# Patient Record
Sex: Female | Born: 1965
Health system: Southern US, Community
[De-identification: ages and names within clinical notes are randomized; demographics above are authoritative.]

## PROBLEM LIST (undated history)

## (undated) DIAGNOSIS — D649 Anemia, unspecified: Secondary | ICD-10-CM

## (undated) DIAGNOSIS — R600 Localized edema: Secondary | ICD-10-CM

## (undated) DIAGNOSIS — T7840XA Allergy, unspecified, initial encounter: Secondary | ICD-10-CM

## (undated) HISTORY — DX: Allergy, unspecified, initial encounter: T78.40XA

## (undated) HISTORY — DX: Localized edema: R60.0

## (undated) HISTORY — DX: Anemia, unspecified: D64.9

## (undated) HISTORY — PX: NO PAST SURGERIES: SHX2092

## (undated) HISTORY — PX: OTHER SURGICAL HISTORY: SHX169

---

## 2000-10-18 ENCOUNTER — Encounter: Payer: Self-pay | Admitting: Emergency Medicine

## 2000-10-18 ENCOUNTER — Emergency Department (HOSPITAL_COMMUNITY): Admission: EM | Admit: 2000-10-18 | Discharge: 2000-10-18 | Payer: Self-pay | Admitting: Emergency Medicine

## 2002-12-29 ENCOUNTER — Ambulatory Visit (HOSPITAL_COMMUNITY): Admission: RE | Admit: 2002-12-29 | Discharge: 2002-12-29 | Payer: Self-pay | Admitting: Obstetrics & Gynecology

## 2003-01-14 ENCOUNTER — Inpatient Hospital Stay (HOSPITAL_COMMUNITY): Admission: AD | Admit: 2003-01-14 | Discharge: 2003-01-17 | Payer: Self-pay | Admitting: Obstetrics & Gynecology

## 2003-01-18 ENCOUNTER — Encounter: Admission: RE | Admit: 2003-01-18 | Discharge: 2003-02-17 | Payer: Self-pay | Admitting: Obstetrics & Gynecology

## 2003-03-20 ENCOUNTER — Encounter: Admission: RE | Admit: 2003-03-20 | Discharge: 2003-04-19 | Payer: Self-pay | Admitting: Obstetrics & Gynecology

## 2003-04-20 ENCOUNTER — Encounter: Admission: RE | Admit: 2003-04-20 | Discharge: 2003-05-20 | Payer: Self-pay | Admitting: Obstetrics & Gynecology

## 2003-06-18 ENCOUNTER — Encounter: Admission: RE | Admit: 2003-06-18 | Discharge: 2003-07-18 | Payer: Self-pay | Admitting: Obstetrics & Gynecology

## 2003-08-18 ENCOUNTER — Encounter: Admission: RE | Admit: 2003-08-18 | Discharge: 2003-09-17 | Payer: Self-pay | Admitting: Obstetrics & Gynecology

## 2006-03-05 ENCOUNTER — Encounter: Admission: RE | Admit: 2006-03-05 | Discharge: 2006-03-05 | Payer: Self-pay | Admitting: Obstetrics & Gynecology

## 2007-07-05 ENCOUNTER — Encounter: Admission: RE | Admit: 2007-07-05 | Discharge: 2007-07-05 | Payer: Self-pay | Admitting: Obstetrics & Gynecology

## 2007-10-06 ENCOUNTER — Emergency Department (HOSPITAL_COMMUNITY): Admission: EM | Admit: 2007-10-06 | Discharge: 2007-10-07 | Payer: Self-pay | Admitting: Emergency Medicine

## 2008-12-13 ENCOUNTER — Encounter: Admission: RE | Admit: 2008-12-13 | Discharge: 2008-12-13 | Payer: Self-pay | Admitting: Family Medicine

## 2010-03-23 ENCOUNTER — Encounter: Payer: Self-pay | Admitting: Family Medicine

## 2010-03-25 ENCOUNTER — Encounter: Payer: Self-pay | Admitting: Obstetrics & Gynecology

## 2010-09-16 ENCOUNTER — Other Ambulatory Visit (HOSPITAL_COMMUNITY): Payer: Self-pay | Admitting: Family Medicine

## 2010-09-16 DIAGNOSIS — Z1231 Encounter for screening mammogram for malignant neoplasm of breast: Secondary | ICD-10-CM

## 2010-09-24 ENCOUNTER — Ambulatory Visit (HOSPITAL_COMMUNITY)
Admission: RE | Admit: 2010-09-24 | Discharge: 2010-09-24 | Disposition: A | Discharge status: Home / Self Care | Payer: BC Managed Care – PPO | Source: Ambulatory Visit | Attending: Family Medicine | Admitting: Family Medicine

## 2010-09-24 DIAGNOSIS — Z1231 Encounter for screening mammogram for malignant neoplasm of breast: Secondary | ICD-10-CM | POA: Insufficient documentation

## 2011-09-15 ENCOUNTER — Other Ambulatory Visit (HOSPITAL_COMMUNITY): Payer: Self-pay | Admitting: Family Medicine

## 2011-09-15 DIAGNOSIS — Z1231 Encounter for screening mammogram for malignant neoplasm of breast: Secondary | ICD-10-CM

## 2011-10-02 ENCOUNTER — Ambulatory Visit (HOSPITAL_COMMUNITY)
Admission: RE | Admit: 2011-10-02 | Discharge: 2011-10-02 | Disposition: A | Discharge status: Home / Self Care | Payer: BC Managed Care – PPO | Source: Ambulatory Visit | Attending: Family Medicine | Admitting: Family Medicine

## 2011-10-02 DIAGNOSIS — Z1231 Encounter for screening mammogram for malignant neoplasm of breast: Secondary | ICD-10-CM | POA: Insufficient documentation

## 2011-10-03 ENCOUNTER — Encounter: Payer: BC Managed Care – PPO | Admitting: Advanced Practice Midwife

## 2011-10-31 ENCOUNTER — Encounter: Payer: BC Managed Care – PPO | Admitting: Obstetrics & Gynecology

## 2012-05-14 ENCOUNTER — Encounter: Payer: BC Managed Care – PPO | Admitting: Obstetrics & Gynecology

## 2012-06-16 ENCOUNTER — Encounter: Payer: BC Managed Care – PPO | Admitting: Obstetrics & Gynecology

## 2014-02-28 ENCOUNTER — Other Ambulatory Visit (HOSPITAL_COMMUNITY): Payer: Self-pay | Admitting: Family Medicine

## 2014-02-28 DIAGNOSIS — Z1231 Encounter for screening mammogram for malignant neoplasm of breast: Secondary | ICD-10-CM

## 2014-03-16 ENCOUNTER — Ambulatory Visit (HOSPITAL_COMMUNITY)
Admission: RE | Admit: 2014-03-16 | Discharge: 2014-03-16 | Disposition: A | Discharge status: Home / Self Care | Payer: BC Managed Care – PPO | Source: Ambulatory Visit | Attending: Family Medicine | Admitting: Family Medicine

## 2014-03-16 DIAGNOSIS — Z1231 Encounter for screening mammogram for malignant neoplasm of breast: Secondary | ICD-10-CM | POA: Insufficient documentation

## 2015-02-27 ENCOUNTER — Other Ambulatory Visit (HOSPITAL_BASED_OUTPATIENT_CLINIC_OR_DEPARTMENT_OTHER): Payer: Self-pay | Admitting: Internal Medicine

## 2015-02-27 DIAGNOSIS — Z1231 Encounter for screening mammogram for malignant neoplasm of breast: Secondary | ICD-10-CM

## 2015-04-26 ENCOUNTER — Ambulatory Visit (HOSPITAL_BASED_OUTPATIENT_CLINIC_OR_DEPARTMENT_OTHER): Payer: BC Managed Care – PPO

## 2015-06-01 ENCOUNTER — Ambulatory Visit (HOSPITAL_BASED_OUTPATIENT_CLINIC_OR_DEPARTMENT_OTHER): Payer: BC Managed Care – PPO

## 2015-06-12 ENCOUNTER — Ambulatory Visit (HOSPITAL_BASED_OUTPATIENT_CLINIC_OR_DEPARTMENT_OTHER)
Admission: RE | Admit: 2015-06-12 | Discharge: 2015-06-12 | Disposition: A | Discharge status: Home / Self Care | Payer: BC Managed Care – PPO | Source: Ambulatory Visit | Attending: Internal Medicine | Admitting: Internal Medicine

## 2015-06-12 DIAGNOSIS — Z1231 Encounter for screening mammogram for malignant neoplasm of breast: Secondary | ICD-10-CM | POA: Diagnosis not present

## 2015-07-24 ENCOUNTER — Ambulatory Visit: Payer: Self-pay | Admitting: Obstetrics

## 2015-08-16 ENCOUNTER — Ambulatory Visit: Payer: Self-pay | Admitting: Obstetrics

## 2015-09-03 ENCOUNTER — Ambulatory Visit (INDEPENDENT_AMBULATORY_CARE_PROVIDER_SITE_OTHER): Payer: BC Managed Care – PPO | Admitting: Obstetrics & Gynecology

## 2015-09-03 ENCOUNTER — Encounter: Payer: Self-pay | Admitting: Obstetrics & Gynecology

## 2015-09-03 VITALS — BP 105/67 | HR 63 | Resp 16 | Ht 63.0 in | Wt 166.0 lb

## 2015-09-03 DIAGNOSIS — N921 Excessive and frequent menstruation with irregular cycle: Secondary | ICD-10-CM | POA: Diagnosis not present

## 2015-09-03 DIAGNOSIS — D259 Leiomyoma of uterus, unspecified: Secondary | ICD-10-CM | POA: Diagnosis not present

## 2015-09-03 DIAGNOSIS — Z113 Encounter for screening for infections with a predominantly sexual mode of transmission: Secondary | ICD-10-CM

## 2015-09-03 NOTE — Progress Notes (Signed)
Patient ID: ADISYN KAMAN, female   DOB: 09/23/1965, 50 y.o.   MRN: [id number] History:  50 y.o. G2P1011 here today for eval of AUB.  [id number].  LNMP 05/29/2015. Pt reports that April 24-28 she reports bleeding like reg menes.  Pt reports bleeding again May 9-16 the bleeding was heavy with clots.  Pt began menses June 2-Jun 7.  Then Jun 24-Jun 29th.    Pt reports that her initial cycles was 28 days.  Pt reports increased risk of cramping.   Pt reports hot flashes.  Sx began Jan 2017.  Pt denies mood changes.      Pt is not sexually active currently.  Was screen for STI's 2 years prev.  Uses condoms with all intercourse.  Pt not on birth control has always used condoms.  The following portions of the patient's history were reviewed and updated as appropriate: allergies, current medications, past family history, past medical history, past social history, past surgical history and problem list.  Review of Systems:  Pertinent items are noted in HPI.  Objective:  Physical Exam Blood pressure 105/67, pulse 63, resp. rate 16, height 5\' 3"  (1.6 m), weight 166 lb (75.297 kg), last menstrual period 08/25/2015. Gen: NAD Abd: Soft, nontender and nondistended Pelvic: Normal appearing external genitalia; normal appearing vaginal mucosa and cervix.  Normal discharge.  Enlarged uterus- difficult to assess size due to firm rectus, no other palpable masses, no uterine or adnexal tenderness    Assessment & Plan:  Perimenopausal sx AUB- need to r/o STI   Fibroids   F/u STI screen cx and blood work Pelvic sono F/u in 3 months  Gottlieb Zuercher L. Harraway-Smith, M.D., Cherlynn June

## 2015-09-03 NOTE — Patient Instructions (Addendum)
Perimenopause Perimenopause is the time when your body begins to move into the menopause (no menstrual period for 12 straight months). It is a natural process. Perimenopause can begin 2-8 years before the menopause and usually lasts for 1 year after the menopause. During this time, your ovaries may or may not produce an egg. The ovaries vary in their production of estrogen and progesterone hormones each month. This can cause irregular menstrual periods, difficulty getting pregnant, vaginal bleeding between periods, and uncomfortable symptoms. CAUSES  Irregular production of the ovarian hormones, estrogen and progesterone, and not ovulating every month.  Other causes include:  Tumor of the pituitary gland in the brain.  Medical disease that affects the ovaries.  Radiation treatment.  Chemotherapy.  Unknown causes.  Heavy smoking and excessive alcohol intake can bring on perimenopause sooner. SIGNS AND SYMPTOMS   Hot flashes.  Night sweats.  Irregular menstrual periods.  Decreased sex drive.  Vaginal dryness.  Headaches.  Mood swings.  Depression.  Memory problems.  Irritability.  Tiredness.  Weight gain.  Trouble getting pregnant.  The beginning of losing bone cells (osteoporosis).  The beginning of hardening of the arteries (atherosclerosis). DIAGNOSIS  Your health care provider will make a diagnosis by analyzing your age, menstrual history, and symptoms. He or she will do a physical exam and note any changes in your body, especially your female organs. Female hormone tests may or may not be helpful depending on the amount of female hormones you produce and when you produce them. However, other hormone tests may be helpful to rule out other problems. TREATMENT  In some cases, no treatment is needed. The decision on whether treatment is necessary during the perimenopause should be made by you and your health care provider based on how the symptoms are affecting you  and your lifestyle. Various treatments are available, such as:  Treating individual symptoms with a specific medicine for that symptom.  Herbal medicines that can help specific symptoms.  Counseling.  Group therapy. HOME CARE INSTRUCTIONS   Keep track of your menstrual periods (when they occur, how heavy they are, how long between periods, and how long they last) as well as your symptoms and when they started.  Only take over-the-counter or prescription medicines as directed by your health care provider.  Sleep and rest.  Exercise.  Eat a diet that contains calcium (good for your bones) and soy (acts like the estrogen hormone).  Do not smoke.  Avoid alcoholic beverages.  Take vitamin supplements as recommended by your health care provider. Taking vitamin E may help in certain cases.  Take calcium and vitamin D supplements to help prevent bone loss.  Group therapy is sometimes helpful.  Acupuncture may help in some cases. SEEK MEDICAL CARE IF:   You have questions about any symptoms you are having.  You need a referral to a specialist (gynecologist, psychiatrist, or psychologist). SEEK IMMEDIATE MEDICAL CARE IF:   You have vaginal bleeding.  Your period lasts longer than 8 days.  Your periods are recurring sooner than 21 days.  You have bleeding after intercourse.  You have severe depression.  You have pain when you urinate.  You have severe headaches.  You have vision problems.   This information is not intended to replace advice given to you by your health care provider. Make sure you discuss any questions you have with your health care provider.   Document Released: 03/27/2004 Document Revised: 03/10/2014 Document Reviewed: 09/16/2012 Elsevier Interactive Patient Education [employer].  Uterine Fibroids Uterine fibroids are tissue masses (tumors) that can develop in the womb (uterus). They are also called leiomyomas. This type of tumor is not  cancerous (benign) and does not spread to other parts of the body outside of the pelvic area, which is between the hip bones. Occasionally, fibroids may develop in the fallopian tubes, in the cervix, or on the support structures (ligaments) that surround the uterus. You can have one or many fibroids. Fibroids can vary in size, weight, and where they grow in the uterus. Some can become quite large. Most fibroids do not require medical treatment. CAUSES A fibroid can develop when a single uterine cell keeps growing (replicating). Most cells in the human body have a control mechanism that keeps them from replicating without control. SIGNS AND SYMPTOMS Symptoms may include:  Heavy bleeding during your period. Bleeding or spotting between periods. Pelvic pain and pressure. Bladder problems, such as needing to urinate more often (urinary frequency) or urgently. Inability to reproduce offspring (infertility). Miscarriages. DIAGNOSIS Uterine fibroids are diagnosed through a physical exam. Your health care provider may feel the lumpy tumors during a pelvic exam. Ultrasonography and an MRI may be done to determine the size, location, and number of fibroids. TREATMENT Treatment may include: Watchful waiting. This involves getting the fibroid checked by your health care provider to see if it grows or shrinks. Follow your health care provider's recommendations for how often to have this checked. Hormone medicines. These can be taken by mouth or given through an intrauterine device (IUD). Surgery. Removing the fibroids (myomectomy) or the uterus (hysterectomy). Removing blood supply to the fibroids (uterine artery embolization). If fibroids interfere with your fertility and you want to become pregnant, your health care provider may recommend having the fibroids removed.  HOME CARE INSTRUCTIONS Keep all follow-up visits as directed by your health care provider. This is important. Take medicines only as  directed by your health care provider. If you were prescribed a hormone treatment, take the hormone medicines exactly as directed. Do not take aspirin, because it can cause bleeding. Ask your health care provider about taking iron pills and increasing the amount of dark green, leafy vegetables in your diet. These actions can help to boost your blood iron levels, which may be affected by heavy menstrual bleeding. Pay close attention to your period and tell your health care provider about any changes, such as: Increased blood flow that requires you to use more pads or tampons than usual per month. A change in the number of days that your period lasts per month. A change in symptoms that are associated with your period, such as abdominal cramping or back pain. SEEK MEDICAL CARE IF: You have pelvic pain, back pain, or abdominal cramps that cannot be controlled with medicines. You have an increase in bleeding between and during periods. You soak tampons or pads in a half hour or less. You feel lightheaded, extra tired, or weak. SEEK IMMEDIATE MEDICAL CARE IF: You faint. You have a sudden increase in pelvic pain.   This information is not intended to replace advice given to you by your health care provider. Make sure you discuss any questions you have with your health care provider.   Document Released: 02/15/2000 Document Revised: 03/10/2014 Document Reviewed: 08/16/2013 Elsevier Interactive Patient Education [employer].

## 2015-09-04 LAB — HEPATITIS PANEL, ACUTE
HCV Ab: NEGATIVE
Hep A IgM: NONREACTIVE
Hep B C IgM: NONREACTIVE
Hepatitis B Surface Ag: NEGATIVE

## 2015-09-04 LAB — HIV ANTIBODY (ROUTINE TESTING W REFLEX): HIV 1&2 Ab, 4th Generation: NONREACTIVE

## 2015-09-04 LAB — RPR

## 2015-09-05 ENCOUNTER — Ambulatory Visit (HOSPITAL_BASED_OUTPATIENT_CLINIC_OR_DEPARTMENT_OTHER)
Admission: RE | Admit: 2015-09-05 | Discharge: 2015-09-05 | Disposition: A | Discharge status: Home / Self Care | Payer: BC Managed Care – PPO | Source: Ambulatory Visit | Attending: Obstetrics & Gynecology | Admitting: Obstetrics & Gynecology

## 2015-09-05 DIAGNOSIS — D251 Intramural leiomyoma of uterus: Secondary | ICD-10-CM | POA: Diagnosis not present

## 2015-09-05 DIAGNOSIS — D259 Leiomyoma of uterus, unspecified: Secondary | ICD-10-CM | POA: Diagnosis not present

## 2015-09-05 DIAGNOSIS — N83201 Unspecified ovarian cyst, right side: Secondary | ICD-10-CM | POA: Diagnosis not present

## 2015-09-05 DIAGNOSIS — N921 Excessive and frequent menstruation with irregular cycle: Secondary | ICD-10-CM

## 2015-09-05 DIAGNOSIS — D250 Submucous leiomyoma of uterus: Secondary | ICD-10-CM | POA: Insufficient documentation

## 2015-09-05 LAB — GC/CHLAMYDIA PROBE AMP (~~LOC~~) NOT AT ARMC
Chlamydia: NEGATIVE
Neisseria Gonorrhea: NEGATIVE

## 2015-10-15 DIAGNOSIS — Z6830 Body mass index (BMI) 30.0-30.9, adult: Secondary | ICD-10-CM | POA: Diagnosis not present

## 2015-10-15 DIAGNOSIS — E559 Vitamin D deficiency, unspecified: Secondary | ICD-10-CM | POA: Diagnosis not present

## 2015-10-15 DIAGNOSIS — N926 Irregular menstruation, unspecified: Secondary | ICD-10-CM | POA: Diagnosis not present

## 2015-10-15 DIAGNOSIS — M25511 Pain in right shoulder: Secondary | ICD-10-CM | POA: Diagnosis not present

## 2015-10-26 ENCOUNTER — Encounter: Payer: Self-pay | Admitting: *Deleted

## 2016-04-04 DIAGNOSIS — J1089 Influenza due to other identified influenza virus with other manifestations: Secondary | ICD-10-CM | POA: Diagnosis not present

## 2016-08-18 ENCOUNTER — Encounter: Payer: Self-pay | Admitting: Obstetrics & Gynecology

## 2016-08-18 ENCOUNTER — Ambulatory Visit (INDEPENDENT_AMBULATORY_CARE_PROVIDER_SITE_OTHER): Payer: BC Managed Care – PPO | Admitting: Obstetrics & Gynecology

## 2016-08-18 VITALS — BP 112/66 | HR 65 | Ht 61.0 in | Wt 177.0 lb

## 2016-08-18 DIAGNOSIS — Z01419 Encounter for gynecological examination (general) (routine) without abnormal findings: Secondary | ICD-10-CM

## 2016-08-18 DIAGNOSIS — Z1151 Encounter for screening for human papillomavirus (HPV): Secondary | ICD-10-CM

## 2016-08-18 DIAGNOSIS — Z1239 Encounter for other screening for malignant neoplasm of breast: Secondary | ICD-10-CM

## 2016-08-18 DIAGNOSIS — D250 Submucous leiomyoma of uterus: Secondary | ICD-10-CM

## 2016-08-18 DIAGNOSIS — N921 Excessive and frequent menstruation with irregular cycle: Secondary | ICD-10-CM

## 2016-08-18 NOTE — Patient Instructions (Addendum)
Uterine Fibroids Uterine fibroids are tissue masses (tumors) that can develop in the womb (uterus). They are also called leiomyomas. This type of tumor is not cancerous (benign) and does not spread to other parts of the body outside of the pelvic area, which is between the hip bones. Occasionally, fibroids may develop in the fallopian tubes, in the cervix, or on the support structures (ligaments) that surround the uterus. You can have one or many fibroids. Fibroids can vary in size, weight, and where they grow in the uterus. Some can become quite large. Most fibroids do not require medical treatment. What are the causes? A fibroid can develop when a single uterine cell keeps growing (replicating). Most cells in the human body have a control mechanism that keeps them from replicating without control. What are the signs or symptoms? Symptoms may include:  Heavy bleeding during your period.  Bleeding or spotting between periods.  Pelvic pain and pressure.  Bladder problems, such as needing to urinate more often (urinary frequency) or urgently.  Inability to reproduce offspring (infertility).  Miscarriages.  How is this diagnosed? Uterine fibroids are diagnosed through a physical exam. Your health care provider may feel the lumpy tumors during a pelvic exam. Ultrasonography and an MRI may be done to determine the size, location, and number of fibroids. How is this treated? Treatment may include:  Watchful waiting. This involves getting the fibroid checked by your health care provider to see if it grows or shrinks. Follow your health care provider's recommendations for how often to have this checked.  Hormone medicines. These can be taken by mouth or given through an intrauterine device (IUD).  Surgery. ? Removing the fibroids (myomectomy) or the uterus (hysterectomy). ? Removing blood supply to the fibroids (uterine artery embolization).  If fibroids interfere with your fertility and you  want to become pregnant, your health care provider may recommend having the fibroids removed. Follow these instructions at home:  Keep all follow-up visits as directed by your health care provider. This is important.  Take over-the-counter and prescription medicines only as told by your health care provider. ? If you were prescribed a hormone treatment, take the hormone medicines exactly as directed.  Ask your health care provider about taking iron pills and increasing the amount of dark green, leafy vegetables in your diet. These actions can help to boost your blood iron levels, which may be affected by heavy menstrual bleeding.  Pay close attention to your period and tell your health care provider about any changes, such as: ? Increased blood flow that requires you to use more pads or tampons than usual per month. ? A change in the number of days that your period lasts per month. ? A change in symptoms that are associated with your period, such as abdominal cramping or back pain. Contact a health care provider if:  You have pelvic pain, back pain, or abdominal cramps that cannot be controlled with medicines.  You have an increase in bleeding between and during periods.  You soak tampons or pads in a half hour or less.  You feel lightheaded, extra tired, or weak. Get help right away if:  You faint.  You have a sudden increase in pelvic pain. This information is not intended to replace advice given to you by your health care provider. Make sure you discuss any questions you have with your health care provider. Document Released: 02/15/2000 Document Revised: 10/18/2015 Document Reviewed: 08/16/2013 Elsevier Interactive Patient Education  2018 Elsevier Inc[employer]    intrauterine device (IUD) What is this medicine? LEVONORGESTREL IUD (LEE voe nor jes trel) is a contraceptive (birth control) device. The device is placed inside the uterus by a healthcare professional. It is used to  prevent pregnancy. This device can also be used to treat heavy bleeding that occurs during your period. This medicine may be used for other purposes; ask your health care provider or pharmacist if you have questions. COMMON BRAND NAME(S): Kyleena, LILETTA, Mirena, Skyla What should I tell my health care provider before I take this medicine? They need to know if you have any of these conditions: -abnormal Pap smear -cancer of the breast, uterus, or cervix -diabetes -endometritis -genital or pelvic infection now or in the past -have more than one sexual partner or your partner has more than one partner -heart disease -history of an ectopic or tubal pregnancy -immune system problems -IUD in place -liver disease or tumor -problems with blood clots or take blood-thinners -seizures -use intravenous drugs -uterus of unusual shape -vaginal bleeding that has not been explained -an unusual or allergic reaction to levonorgestrel, other hormones, silicone, or polyethylene, medicines, foods, dyes, or preservatives -pregnant or trying to get pregnant -breast-feeding How should I use this medicine? This device is placed inside the uterus by a health care professional. Talk to your pediatrician regarding the use of this medicine in children. Special care may be needed. Overdosage: If you think you have taken too much of this medicine contact a poison control center or emergency room at once. NOTE: This medicine is only for you. Do not share this medicine with others. What if I miss a dose? This does not apply. Depending on the brand of device you have inserted, the device will need to be replaced every 3 to 5 years if you wish to continue using this type of birth control. What may interact with this medicine? Do not take this medicine with any of the following medications: -amprenavir -bosentan -fosamprenavir This medicine may also interact with the following  medications: -aprepitant -armodafinil -barbiturate medicines for inducing sleep or treating seizures -bexarotene -boceprevir -griseofulvin -medicines to treat seizures like carbamazepine, ethotoin, felbamate, oxcarbazepine, phenytoin, topiramate -modafinil -pioglitazone -rifabutin -rifampin -rifapentine -some medicines to treat HIV infection like atazanavir, efavirenz, indinavir, lopinavir, nelfinavir, tipranavir, ritonavir -St. John's wort -warfarin This list may not describe all possible interactions. Give your health care provider a list of all the medicines, herbs, non-prescription drugs, or dietary supplements you use. Also tell them if you smoke, drink alcohol, or use illegal drugs. Some items may interact with your medicine. What should I watch for while using this medicine? Visit your doctor or health care professional for regular check ups. See your doctor if you or your partner has sexual contact with others, becomes HIV positive, or gets a sexual transmitted disease. This product does not protect you against HIV infection (AIDS) or other sexually transmitted diseases. You can check the placement of the IUD yourself by reaching up to the top of your vagina with clean fingers to feel the threads. Do not pull on the threads. It is a good habit to check placement after each menstrual period. Call your doctor right away if you feel more of the IUD than just the threads or if you cannot feel the threads at all. The IUD may come out by itself. You may become pregnant if the device comes out. If you notice that the IUD has come out use a backup birth control method like condoms and call your   health care provider. Using tampons will not change the position of the IUD and are okay to use during your period. This IUD can be safely scanned with magnetic resonance imaging (MRI) only under specific conditions. Before you have an MRI, tell your healthcare provider that you have an IUD in place,  and which type of IUD you have in place. What side effects may I notice from receiving this medicine? Side effects that you should report to your doctor or health care professional as soon as possible: -allergic reactions like skin rash, itching or hives, swelling of the face, lips, or tongue -fever, flu-like symptoms -genital sores -high blood pressure -no menstrual period for 6 weeks during use -pain, swelling, warmth in the leg -pelvic pain or tenderness -severe or sudden headache -signs of pregnancy -stomach cramping -sudden shortness of breath -trouble with balance, talking, or walking -unusual vaginal bleeding, discharge -yellowing of the eyes or skin Side effects that usually do not require medical attention (report to your doctor or health care professional if they continue or are bothersome): -acne -breast pain -change in sex drive or performance -changes in weight -cramping, dizziness, or faintness while the device is being inserted -headache -irregular menstrual bleeding within first 3 to 6 months of use -nausea This list may not describe all possible side effects. Call your doctor for medical advice about side effects. You may report side effects to FDA at 1-800-FDA-1088. Where should I keep my medicine? This does not apply. NOTE: This sheet is a summary. It may not cover all possible information. If you have questions about this medicine, talk to your doctor, pharmacist, or health care provider.  2018 Elsevier/Gold Standard (2015-11-30 14:14:56)  

## 2016-08-18 NOTE — Progress Notes (Signed)
Patient states she present for follow up visit (last visit July 2017).patient would like her annual with pap smear today. Kathrene Alu RNBSN

## 2016-08-18 NOTE — Progress Notes (Signed)
Subjective:     Dawn Leach is a 51 y.o. female here for a routine exam. G2P1011. Pt is s/p SVD of a 5#11oz infant.  Current complaints: Pt report bleeding every 17-21 days for 7 days. She endorses the bleeding as heavy with clots at times.She reports that she has gotten used to the bleeding but, now c/o pain in the pelvis.   Pt denies dizziness but, reports occ SOB   Gynecologic History Patient's last menstrual period was 08/16/2016 (exact date). Contraception: abstinence Last Pap: 2016. Results were: normal Last mammogram: 06/2015. Results were: normal  Obstetric History OB History  Gravida Para Term Preterm AB Living  2 1 1  0 1 1  SAB TAB Ectopic Multiple Live Births  1 0 0 0 1    # Outcome Date GA Lbr Len/2nd Weight Sex Delivery Anes PTL Lv  2 SAB      SAB     1 Term      Vag-Spont   LIV       The following portions of the patient's history were reviewed and updated as appropriate: allergies, current medications, past family history, past medical history, past social history, past surgical history and problem list.  Review of Systems Pertinent items are noted in HPI.    Objective:  BP 112/66 (BP Location: Left Arm)   Pulse 65   Ht 5\' 1"  (1.549 m)   Wt 177 lb (80.3 kg)   LMP 08/16/2016 (Exact Date)   BMI 33.44 kg/m  General Appearance:    Alert, cooperative, no distress, appears stated age  Head:    Normocephalic, without obvious abnormality, atraumatic  Eyes:    conjunctiva/corneas clear, EOM's intact, both eyes  Ears:    Normal external ear canals, both ears  Nose:   Nares normal, septum midline, mucosa normal, no drainage    or sinus tenderness  Throat:   Lips, mucosa, and tongue normal; teeth and gums normal  Neck:   Supple, symmetrical, trachea midline, no adenopathy;    thyroid:  no enlargement/tenderness/nodules  Back:     Symmetric, no curvature, ROM normal, no CVA tenderness  Lungs:     Clear to auscultation bilaterally, respirations unlabored  Chest Wall:     No tenderness or deformity   Heart:    Regular rate and rhythm, S1 and S2 normal, no murmur, rub   or gallop  Breast Exam:    No tenderness, masses, or nipple abnormality  Abdomen:     Soft, non-tender, bowel sounds active all four quadrants,    no masses, no organomegaly  Genitalia:    Normal female without lesion, discharge or tenderness; uterus: enlarged.  Irreg contour. Mobile. min descensus.      Extremities:   Extremities normal, atraumatic, no cyanosis or edema  Pulses:   2+ and symmetric all extremities  Skin:   Skin color, texture, turgor normal, no rashes or lesions    09/05/2015 CLINICAL DATA:  51 year old female with dysfunctional uterine bleeding for 2 months. History of uterine fibroids. LMP 08/25/2015.  EXAM: TRANSABDOMINAL AND TRANSVAGINAL ULTRASOUND OF PELVIS  TECHNIQUE: Both transabdominal and transvaginal ultrasound examinations of the pelvis were performed. Transabdominal technique was performed for global imaging of the pelvis including uterus, ovaries, adnexal regions, and pelvic cul-de-sac. It was necessary to proceed with endovaginal exam following the transabdominal exam to visualize the endometrium, myometrium and adnexa.  COMPARISON:  No prior non obstetric pelvic sonogram.  FINDINGS: Uterus  Measurements: 11.0 x 6.1 x 7.5 cm. The  anteverted uterus is enlarged by multiple fibroids, with representative fibroids as follows:  - posterior uterine body submucosal 1.8 x 1.6 x 2.2 cm fibroid  - separate right posterior uterine body submucosal 1.0 x 0.9 x 0.9 cm fibroid  - right anterior uterine body intramural 1.41.4 x 1.6 cm fibroid  - posterior upper uterine body intramural 2.2 x 2.0 x 2.0 cm fibroid  Endometrium  Thickness: 11 mm. No endometrial cavity fluid or focal endometrial mass demonstrated.  Right ovary  Measurements: 3.4 x 1.5 x 2.3 cm. Right ovarian complex 1.8 x 1.2 x 1.8 cm cyst with heterogeneous internal echoes and  no internal vascularity, consistent with a hemorrhagic cyst. No additional right ovarian or right adnexal masses demonstrated.  Left ovary  Measurements: 3.1 x 2.8 x 2.9 cm. Limited visualization of the left ovary, which appears grossly normal, with no left ovarian or left adnexal mass demonstrated.  Other findings  No abnormal free fluid.  IMPRESSION: 1. Enlarged myomatous uterus, with submucosal fibroids as described. Consider further evaluation of the central uterine fibroids with saline infusion hysterosonography if clinically warranted. 2. Bilayer endometrial thickness 11 mm.  No focal endometrial mass. 3. Right ovarian 1.8 cm hemorrhagic cyst. No suspicious ovarian or adnexal masses. Assessment:    Healthy female exam.   Breast cancer screening Symptomatic uterine fibroids. reviewed with pt treatment option for fibroids including OCPs and LnIUD. Pt desires IUD to manage bleeding   Plan:    Mammogram ordered. Follow up in: 1 year.    F/u PAP with hr HPV rec primary care provider eval wants referral F/u for Endometrial biopsy and placement LnIUD Labs: CBC with diff and TSH   Dawn Leach, M.D., Dawn Leach

## 2016-08-18 NOTE — Addendum Note (Signed)
Addended by: Phill Myron on: 08/18/2016 11:15 AM   Modules accepted: Orders

## 2016-08-19 LAB — CBC WITH DIFFERENTIAL/PLATELET
Basophils Absolute: 0 10*3/uL (ref 0.0–0.2)
Basos: 1 %
EOS (ABSOLUTE): 0.1 10*3/uL (ref 0.0–0.4)
Eos: 4 %
Hematocrit: 33.5 % — ABNORMAL LOW (ref 34.0–46.6)
Hemoglobin: 10.1 g/dL — ABNORMAL LOW (ref 11.1–15.9)
Immature Grans (Abs): 0 10*3/uL (ref 0.0–0.1)
Immature Granulocytes: 0 %
Lymphocytes Absolute: 1.5 10*3/uL (ref 0.7–3.1)
Lymphs: 49 %
MCH: 22.9 pg — ABNORMAL LOW (ref 26.6–33.0)
MCHC: 30.1 g/dL — ABNORMAL LOW (ref 31.5–35.7)
MCV: 76 fL — ABNORMAL LOW (ref 79–97)
Monocytes Absolute: 0.1 10*3/uL (ref 0.1–0.9)
Monocytes: 3 %
Neutrophils Absolute: 1.3 10*3/uL — ABNORMAL LOW (ref 1.4–7.0)
Neutrophils: 43 %
Platelets: 352 10*3/uL (ref 150–379)
RBC: 4.41 x10E6/uL (ref 3.77–5.28)
RDW: 18.2 % — ABNORMAL HIGH (ref 12.3–15.4)
WBC: 3 10*3/uL — ABNORMAL LOW (ref 3.4–10.8)

## 2016-08-19 LAB — TSH: TSH: 1.12 u[IU]/mL (ref 0.450–4.500)

## 2016-08-21 ENCOUNTER — Telehealth: Payer: Self-pay

## 2016-08-21 LAB — CYTOLOGY - PAP
Diagnosis: NEGATIVE
HPV: NOT DETECTED

## 2016-08-21 NOTE — Telephone Encounter (Signed)
Pre visit call made to patient. States she is under the dryer right not and will call back.

## 2016-08-22 ENCOUNTER — Ambulatory Visit (INDEPENDENT_AMBULATORY_CARE_PROVIDER_SITE_OTHER): Payer: BC Managed Care – PPO | Admitting: Family Medicine

## 2016-08-22 ENCOUNTER — Encounter: Payer: Self-pay | Admitting: Family Medicine

## 2016-08-22 ENCOUNTER — Encounter: Payer: Self-pay | Admitting: Gastroenterology

## 2016-08-22 VITALS — BP 102/62 | HR 58 | Temp 98.2°F | Ht 61.0 in | Wt 176.4 lb

## 2016-08-22 DIAGNOSIS — Z23 Encounter for immunization: Secondary | ICD-10-CM | POA: Diagnosis not present

## 2016-08-22 DIAGNOSIS — E669 Obesity, unspecified: Secondary | ICD-10-CM

## 2016-08-22 DIAGNOSIS — E559 Vitamin D deficiency, unspecified: Secondary | ICD-10-CM | POA: Diagnosis not present

## 2016-08-22 DIAGNOSIS — D509 Iron deficiency anemia, unspecified: Secondary | ICD-10-CM

## 2016-08-22 DIAGNOSIS — Z1211 Encounter for screening for malignant neoplasm of colon: Secondary | ICD-10-CM

## 2016-08-22 LAB — IBC PANEL
Iron: 52 ug/dL (ref 42–145)
Saturation Ratios: 10.8 % — ABNORMAL LOW (ref 20.0–50.0)
Transferrin: 344 mg/dL (ref 212.0–360.0)

## 2016-08-22 LAB — VITAMIN D 25 HYDROXY (VIT D DEFICIENCY, FRACTURES): VITD: 59.21 ng/mL (ref 30.00–100.00)

## 2016-08-22 LAB — FERRITIN: Ferritin: 4.3 ng/mL — ABNORMAL LOW (ref 10.0–291.0)

## 2016-08-22 NOTE — Patient Instructions (Signed)
If you do not hear anything about your referral in the next 1-2 weeks, call our office and ask for an update.  Give [place] 2-3 business days to get the results of your labs back.   Healthy Eating Plan Many factors influence your heart health, including eating and exercise habits. Heart (coronary) risk increases with abnormal blood fat (lipid) levels. Heart-healthy meal planning includes limiting unhealthy fats, increasing healthy fats, and making other small dietary changes. This includes maintaining a healthy body weight to help keep lipid levels within a normal range.  WHAT IS MY PLAN?  Your health care provider recommends that you:  Drink a glass of water before meals to help with satiety.  Eat slowly.  An alternative to the water is to add Metamucil. This will help with satiety as well. It does contain calories, unlike water.  WHAT TYPES OF FAT SHOULD I CHOOSE?  Choose healthy fats more often. Choose monounsaturated and polyunsaturated fats, such as olive oil and canola oil, flaxseeds, walnuts, almonds, and seeds.  Eat more omega-3 fats. Good choices include salmon, mackerel, sardines, tuna, flaxseed oil, and ground flaxseeds. Aim to eat fish at least two times each week.  Avoid foods with partially hydrogenated oils in them. These contain trans fats. Examples of foods that contain trans fats are stick margarine, some tub margarines, cookies, crackers, and other baked goods. If you are going to avoid a fat, this is the one to avoid!  WHAT GENERAL GUIDELINES DO I NEED TO FOLLOW?  Check food labels carefully to identify foods with trans fats. Avoid these types of options when possible.  Fill one half of your plate with vegetables and green salads. Eat 4-5 servings of vegetables per day. A serving of vegetables equals 1 cup of raw leafy vegetables,  cup of raw or cooked cut-up vegetables, or  cup of vegetable juice.  Fill one fourth of your plate with whole grains. Look for the word  "whole" as the first word in the ingredient list.  Fill one fourth of your plate with lean protein foods.  Eat 4-5 servings of fruit per day. A serving of fruit equals one medium whole fruit,  cup of dried fruit,  cup of fresh, frozen, or canned fruit. Try to avoid fruits in cups/syrups as the sugar content can be high.  Eat more foods that contain soluble fiber. Examples of foods that contain this type of fiber are apples, broccoli, carrots, beans, peas, and barley. Aim to get 20-30 g of fiber per day.  Eat more home-cooked food and less restaurant, buffet, and fast food.  Limit or avoid alcohol.  Limit foods that are high in starch and sugar.  Avoid fried foods when able.  Cook foods by using methods other than frying. Baking, boiling, grilling, and broiling are all great options. Other fat-reducing suggestions include: ? Removing the skin from poultry. ? Removing all visible fats from meats. ? Skimming the fat off of stews, soups, and gravies before serving them. ? Steaming vegetables in water or broth.  Lose weight if you are overweight. Losing just 5-10% of your initial body weight can help your overall health and prevent diseases such as diabetes and heart disease.  Increase your consumption of nuts, legumes, and seeds to 4-5 servings per week. One serving of dried beans or legumes equals  cup after being cooked, one serving of nuts equals 1 ounces, and one serving of seeds equals  ounce or 1 tablespoon.  WHAT ARE GOOD FOODS CAN  I EAT? Grains Grainy breads (try to find bread that is 3 g of fiber per slice or greater), oatmeal, light popcorn. Whole-grain cereals. Rice and pasta, including brown rice and those that are made with whole wheat. Edamame pasta is a great alternative to grain pasta. It has a higher protein content. Try to avoid significant consumption of white bread, sugary cereals, or pastries/baked goods.  Vegetables All vegetables. Cooked white potatoes do not  count as vegetables.  Fruits All fruits, but limit pineapple and bananas as these fruits have a higher sugar content.  Meats and Other Protein Sources Lean, well-trimmed beef, veal, pork, and lamb. Chicken and [place] without skin. All fish and shellfish. Wild duck, rabbit, pheasant, and venison. Egg whites or low-cholesterol egg substitutes. Dried beans, peas, lentils, and tofu.Seeds and most nuts.  Dairy Low-fat or nonfat cheeses, including ricotta, string, and mozzarella. Skim or 1% milk that is liquid, powdered, or evaporated. Buttermilk that is made with low-fat milk. Nonfat or low-fat yogurt. Soy/Almond milk are good alternatives if you cannot handle dairy.  Beverages Water is the best for you. Sports drinks with less sugar are more desirable unless you are a highly active athlete.  Sweets and Desserts Sherbets and fruit ices. Honey, jam, marmalade, jelly, and syrups. Dark chocolate.  Eat all sweets and desserts in moderation.  Fats and Oils Nonhydrogenated (trans-free) margarines. Vegetable oils, including soybean, sesame, sunflower, olive, peanut, safflower, corn, canola, and cottonseed. Salad dressings or mayonnaise that are made with a vegetable oil. Limit added fats and oils that you use for cooking, baking, salads, and as spreads.  Other Cocoa powder. Coffee and tea. Most condiments.  The items listed above may not be a complete list of recommended foods or beverages. Contact your dietitian for more options.

## 2016-08-22 NOTE — Progress Notes (Signed)
Chief Complaint  Patient presents with  . Establish Care    pt want to discuss recent lab results,IUD, and weight loss       New Patient Visit SUBJECTIVE: HPI: Dawn Leach is an 51 y.o.female who is being seen for establishing care.  The patient has a history of fibroids. She was recommended by her OB/GYN to have an IUD placed to control bleeding. She would like my opinion on the matter.  The patient also has difficulty losing weight. She does exercise routinely at the [facility]. She maintains a healthy diet as well. Portion control is not and issue for her.  She was diagnosed with low vitamin D around 1 year ago. She was placed on 50,000 units twice weekly and then told to take over-the-counter supplementation daily after that. She is currently taking 50,000 units per day. Her vitamin D was never rechecked.  No Known Allergies  Past Medical History:  Diagnosis Date  . Allergy    No past surgical history on file. Social History   Social History  . Marital status: Married   Social History Main Topics  . Smoking status: Never Smoker  . Smokeless tobacco: Never Used  . Alcohol use No  . Drug use: No  . Sexual activity: Not Currently   Family History  Problem Relation Age of Onset  . Hypertension Father   . Diabetes Father   . Colon cancer Mother   . Cancer Mother   . Hypertension Mother   . Stroke Mother    Takes no medications routinely.  Patient's last menstrual period was 08/16/2016 (exact date).  ROS Cardiovascular: Denies chest pain  Respiratory: Denies dyspnea   OBJECTIVE: BP 102/62 (BP Location: Left Arm, Patient Position: Sitting, Cuff Size: Normal)   Pulse (!) 58   Temp 98.2 F (36.8 C) (Oral)   Ht 5\' 1"  (1.549 m)   Wt 176 lb 6.4 oz (80 kg)   LMP 08/16/2016 (Exact Date)   SpO2 98%   BMI 33.33 kg/m   Constitutional: -  VS reviewed -  Well developed, well nourished, appears stated age -  No apparent distress  Psychiatric: -  Oriented to person,  place, and time -  Memory intact -  Affect and mood normal -  Fluent conversation, good eye contact -  Judgment and insight age appropriate  Eye: -  Conjunctivae clear, no discharge -  Pupils symmetric, round, reactive to light  ENMT: -  Ears are patent b/l without erythema or discharge. TM's are shiny and clear b/l without evidence of effusion or infection. -  Oral mucosa without lesions, tongue and uvula midline    Tonsils not enlarged, no erythema, no exudate, trachea midline    Pharynx moist, no lesions, no erythema  Neck: -  No gross swelling, no palpable masses -  Thyroid midline, not enlarged, mobile, no palpable masses  Cardiovascular: -  RRR, no murmurs -  No LE edema  Respiratory: -  Normal respiratory effort, no accessory muscle use, no retraction -  Breath sounds equal, no wheezes, no ronchi, no crackles  Gastrointestinal: -  Bowel sounds normal -  No tenderness, no distention, no guarding, no masses  Neurological:  -  CN II - XII grossly intact -  Sensation grossly intact to light touch, equal bilaterally  Musculoskeletal: -  No clubbing, no cyanosis -  Gait normal  Skin: -  No significant lesion on inspection -  Warm and dry to palpation   ASSESSMENT/PLAN: Obesity (BMI 30-39.9) -  Plan: Amb Ref to Medical Weight Management  Screen for colon cancer - Plan: Ambulatory referral to Gastroenterology  Vitamin D deficiency - Plan: Vitamin D (25 hydroxy)  Microcytic anemia - Plan: IBC panel, Ferritin  Need for Tdap vaccination - Plan: Tdap vaccine greater than or equal to 7yo IM  Patient instructed to sign release of records form from her previous PCP. Healthy diet handout given. Counseled on diet and exercise. Challenged her to lift weights consistently. We'll recheck vitamin D today. Also check iron panel to verify iron deficiency and rule out a suggestion of thalassemia. I encouraged her to take ferrous sulfate as ordered by Dr. Tamala Julian. Regarding her IUD, I stated it  may be worthwhile bringing up concern of proper IUD placement in the setting of fibroids. If Dr. Tamala Julian has no concerns with this, I believe it is a good option. Patient should return prn. The patient voiced understanding and agreement to the plan.   [place], DO 08/22/16  12:39 PM

## 2016-08-25 ENCOUNTER — Ambulatory Visit (HOSPITAL_BASED_OUTPATIENT_CLINIC_OR_DEPARTMENT_OTHER): Payer: BC Managed Care – PPO

## 2016-08-27 ENCOUNTER — Telehealth: Payer: Self-pay | Admitting: Family Medicine

## 2016-08-27 NOTE — Telephone Encounter (Signed)
Reviewed labs- Cholesterol 218, TG's 116, HDL 72, LDL 122 A1c 5.4 BUN/Cr 10/0.9 Labs done on 06/19/16 Tested for HIV, neg

## 2016-09-01 ENCOUNTER — Encounter (HOSPITAL_BASED_OUTPATIENT_CLINIC_OR_DEPARTMENT_OTHER): Payer: Self-pay

## 2016-09-01 ENCOUNTER — Ambulatory Visit (HOSPITAL_BASED_OUTPATIENT_CLINIC_OR_DEPARTMENT_OTHER)
Admission: RE | Admit: 2016-09-01 | Discharge: 2016-09-01 | Disposition: A | Discharge status: Home / Self Care | Payer: BC Managed Care – PPO | Source: Ambulatory Visit | Attending: Obstetrics & Gynecology | Admitting: Obstetrics & Gynecology

## 2016-09-01 DIAGNOSIS — Z1231 Encounter for screening mammogram for malignant neoplasm of breast: Secondary | ICD-10-CM | POA: Insufficient documentation

## 2016-09-01 DIAGNOSIS — Z1239 Encounter for other screening for malignant neoplasm of breast: Secondary | ICD-10-CM

## 2016-09-24 ENCOUNTER — Ambulatory Visit: Payer: BC Managed Care – PPO | Admitting: Obstetrics & Gynecology

## 2016-09-29 ENCOUNTER — Ambulatory Visit (INDEPENDENT_AMBULATORY_CARE_PROVIDER_SITE_OTHER): Payer: BC Managed Care – PPO | Admitting: Obstetrics & Gynecology

## 2016-09-29 ENCOUNTER — Encounter: Payer: Self-pay | Admitting: Obstetrics & Gynecology

## 2016-09-29 VITALS — BP 117/74 | HR 69 | Ht 61.0 in | Wt 173.0 lb

## 2016-09-29 DIAGNOSIS — D252 Subserosal leiomyoma of uterus: Secondary | ICD-10-CM | POA: Diagnosis not present

## 2016-09-29 DIAGNOSIS — Z01812 Encounter for preprocedural laboratory examination: Secondary | ICD-10-CM

## 2016-09-29 DIAGNOSIS — Z3202 Encounter for pregnancy test, result negative: Secondary | ICD-10-CM

## 2016-09-29 DIAGNOSIS — N939 Abnormal uterine and vaginal bleeding, unspecified: Secondary | ICD-10-CM | POA: Diagnosis not present

## 2016-09-29 DIAGNOSIS — N858 Other specified noninflammatory disorders of uterus: Secondary | ICD-10-CM | POA: Diagnosis not present

## 2016-09-29 DIAGNOSIS — D251 Intramural leiomyoma of uterus: Secondary | ICD-10-CM

## 2016-09-29 DIAGNOSIS — Z30430 Encounter for insertion of intrauterine contraceptive device: Secondary | ICD-10-CM

## 2016-09-29 LAB — POCT URINE PREGNANCY: Preg Test, Ur: NEGATIVE

## 2016-09-29 MED ORDER — LEVONORGESTREL 18.6 MCG/DAY IU IUD
INTRAUTERINE_SYSTEM | Freq: Once | INTRAUTERINE | Status: AC
  Administered 2016-09-29: 1 via INTRAUTERINE

## 2016-09-29 NOTE — Progress Notes (Signed)
GYNECOLOGY CLINIC PROCEDURE NOTE  Dawn Leach is a 51 y.o. G2P1011 here for Mirena IUD insertion. No GYN concerns.  Last pap smear was on 08/18/2016 and was normal.  Pt with a h/o AUB, here today for ENDO bx with placement following for LnIUD to assist with bleeding control.    The indications for endometrial biopsy were reviewed.   Risks of the biopsy including cramping, bleeding, infection, uterine perforation, inadequate specimen and need for additional procedures  were discussed. The patient states she understands and agrees to undergo procedure today. Consent was signed. Time out was performed. Urine HCG was negative. A sterile speculum was placed in the patient's vagina and the cervix was prepped with Betadine. A single-toothed tenaculum was placed on the anterior lip of the cervix to stabilize it. The 3 mm pipelle was introduced into the endometrial cavity without difficulty to a depth of 7cm, and a moderate amount of tissue was obtained and sent to pathology.   IUD Insertion Procedure Note Patient identified, informed consent performed.  Discussed risks of irregular bleeding, cramping, infection, malpositioning or misplacement of the IUD outside the uterus which may require further procedures. Time out was performed.  Urine pregnancy test negative.  Cervix visualized.  Cleaned with Betadine x 2.  Grasped anteriorly with a single tooth tenaculum.  Uterus sounded to 7 cm. Liletta IUD placed per manufacturer's recommendations.  Strings trimmed to 3 cm. Tenaculum was removed, good hemostasis noted.  Patient tolerated procedure well.   Patient was given post-procedure instructions.  Patient was asked to follow up in 4 weeks for IUD check.  Tenika Keeran L. Harraway-Smith, M.D., Cherlynn June

## 2016-09-29 NOTE — Patient Instructions (Signed)
Intrauterine Device Insertion, Care After This sheet gives you information about how to care for yourself after your procedure. Your health care provider may also give you more specific instructions. If you have problems or questions, contact your health care provider. What can I expect after the procedure? After the procedure, it is common to have:  Cramps and pain in the abdomen.  Light bleeding (spotting) or heavier bleeding that is like your menstrual period. This may last for up to a few days.  Lower back pain.  Dizziness.  Headaches.  Nausea.  Follow these instructions at home:  Before resuming sexual activity, check to make sure that you can feel the IUD string(s). You should be able to feel the end of the string(s) below the opening of your cervix. If your IUD string is in place, you may resume sexual activity. ? If you had a hormonal IUD inserted more than 7 days after your most recent period started, you will need to use a backup method of birth control for 7 days after IUD insertion. Ask your health care provider whether this applies to you.  Continue to check that the IUD is still in place by feeling for the string(s) after every menstrual period, or once a month.  Take over-the-counter and prescription medicines only as told by your health care provider.  Do not drive or use heavy machinery while taking prescription pain medicine.  Keep all follow-up visits as told by your health care provider. This is important. Contact a health care provider if:  You have bleeding that is heavier or lasts longer than a normal menstrual cycle.  You have a fever.  You have cramps or abdominal pain that get worse or do not get better with medicine.  You develop abdominal pain that is new or is not in the same area of earlier cramping and pain.  You feel lightheaded or weak.  You have abnormal or bad-smelling discharge from your vagina.  You have pain during sexual  activity.  You have any of the following problems with your IUD string(s): ? The string bothers or hurts you or your sexual partner. ? You cannot feel the string. ? The string has gotten longer.  You can feel the IUD in your vagina.  You think you may be pregnant, or you miss your menstrual period.  You think you may have an STI (sexually transmitted infection). Get help right away if:  You have flu-like symptoms.  You have a fever and chills.  You can feel that your IUD has slipped out of place. Summary  After the procedure, it is common to have cramps and pain in the abdomen. It is also common to have light bleeding (spotting) or heavier bleeding that is like your menstrual period.  Continue to check that the IUD is still in place by feeling for the string(s) after every menstrual period, or once a month.  Keep all follow-up visits as told by your health care provider. This is important.  Contact your health care provider if you have problems with your IUD string(s), such as the string getting longer or bothering you or your sexual partner. This information is not intended to replace advice given to you by your health care provider. Make sure you discuss any questions you have with your health care provider. Document Released: 10/16/2010 Document Revised: 01/09/2016 Document Reviewed: 01/09/2016 Elsevier Interactive Patient Education  2017 Elsevier Inc. Levonorgestrel intrauterine device (IUD) What is this medicine? LEVONORGESTREL IUD (LEE voe nor   jes trel) is a contraceptive (birth control) device. The device is placed inside the uterus by a healthcare professional. It is used to prevent pregnancy. This device can also be used to treat heavy bleeding that occurs during your period. This medicine may be used for other purposes; ask your health care provider or pharmacist if you have questions. COMMON BRAND NAME(S): Kyleena, LILETTA, Mirena, Skyla What should I tell my health  care provider before I take this medicine? They need to know if you have any of these conditions: -abnormal Pap smear -cancer of the breast, uterus, or cervix -diabetes -endometritis -genital or pelvic infection now or in the past -have more than one sexual partner or your partner has more than one partner -heart disease -history of an ectopic or tubal pregnancy -immune system problems -IUD in place -liver disease or tumor -problems with blood clots or take blood-thinners -seizures -use intravenous drugs -uterus of unusual shape -vaginal bleeding that has not been explained -an unusual or allergic reaction to levonorgestrel, other hormones, silicone, or polyethylene, medicines, foods, dyes, or preservatives -pregnant or trying to get pregnant -breast-feeding How should I use this medicine? This device is placed inside the uterus by a health care professional. Talk to your pediatrician regarding the use of this medicine in children. Special care may be needed. Overdosage: If you think you have taken too much of this medicine contact a poison control center or emergency room at once. NOTE: This medicine is only for you. Do not share this medicine with others. What if I miss a dose? This does not apply. Depending on the brand of device you have inserted, the device will need to be replaced every 3 to 5 years if you wish to continue using this type of birth control. What may interact with this medicine? Do not take this medicine with any of the following medications: -amprenavir -bosentan -fosamprenavir This medicine may also interact with the following medications: -aprepitant -armodafinil -barbiturate medicines for inducing sleep or treating seizures -bexarotene -boceprevir -griseofulvin -medicines to treat seizures like carbamazepine, ethotoin, felbamate, oxcarbazepine, phenytoin, topiramate -modafinil -pioglitazone -rifabutin -rifampin -rifapentine -some medicines to  treat HIV infection like atazanavir, efavirenz, indinavir, lopinavir, nelfinavir, tipranavir, ritonavir -St. John's wort -warfarin This list may not describe all possible interactions. Give your health care provider a list of all the medicines, herbs, non-prescription drugs, or dietary supplements you use. Also tell them if you smoke, drink alcohol, or use illegal drugs. Some items may interact with your medicine. What should I watch for while using this medicine? Visit your doctor or health care professional for regular check ups. See your doctor if you or your partner has sexual contact with others, becomes HIV positive, or gets a sexual transmitted disease. This product does not protect you against HIV infection (AIDS) or other sexually transmitted diseases. You can check the placement of the IUD yourself by reaching up to the top of your vagina with clean fingers to feel the threads. Do not pull on the threads. It is a good habit to check placement after each menstrual period. Call your doctor right away if you feel more of the IUD than just the threads or if you cannot feel the threads at all. The IUD may come out by itself. You may become pregnant if the device comes out. If you notice that the IUD has come out use a backup birth control method like condoms and call your health care provider. Using tampons will not change the position of the   IUD and are okay to use during your period. This IUD can be safely scanned with magnetic resonance imaging (MRI) only under specific conditions. Before you have an MRI, tell your healthcare provider that you have an IUD in place, and which type of IUD you have in place. What side effects may I notice from receiving this medicine? Side effects that you should report to your doctor or health care professional as soon as possible: -allergic reactions like skin rash, itching or hives, swelling of the face, lips, or tongue -fever, flu-like symptoms -genital  sores -high blood pressure -no menstrual period for 6 weeks during use -pain, swelling, warmth in the leg -pelvic pain or tenderness -severe or sudden headache -signs of pregnancy -stomach cramping -sudden shortness of breath -trouble with balance, talking, or walking -unusual vaginal bleeding, discharge -yellowing of the eyes or skin Side effects that usually do not require medical attention (report to your doctor or health care professional if they continue or are bothersome): -acne -breast pain -change in sex drive or performance -changes in weight -cramping, dizziness, or faintness while the device is being inserted -headache -irregular menstrual bleeding within first 3 to 6 months of use -nausea This list may not describe all possible side effects. Call your doctor for medical advice about side effects. You may report side effects to FDA at 1-800-FDA-1088. Where should I keep my medicine? This does not apply. NOTE: This sheet is a summary. It may not cover all possible information. If you have questions about this medicine, talk to your doctor, pharmacist, or health care provider.  2018 Elsevier/Gold Standard (2015-11-30 14:14:56)  

## 2016-09-30 ENCOUNTER — Ambulatory Visit (AMBULATORY_SURGERY_CENTER): Payer: Self-pay | Admitting: *Deleted

## 2016-09-30 VITALS — Ht 61.0 in | Wt 174.0 lb

## 2016-09-30 DIAGNOSIS — Z800 Family history of malignant neoplasm of digestive organs: Secondary | ICD-10-CM

## 2016-09-30 MED ORDER — NA SULFATE-K SULFATE-MG SULF 17.5-3.13-1.6 GM/177ML PO SOLN: 1.0000 | Freq: Once | ORAL | 0 refills | Status: AC

## 2016-09-30 NOTE — Progress Notes (Signed)
No egg or soy allergy known to patient  No issues with past sedation with any surgeries  or procedures, no intubation problems  No diet pills with phentermine  per patient but takes a diet supplement from [employer]  No home 02 use per patient  No blood thinners per patient  Pt denies issues with constipation  No A fib or A flutter  EMMI video sent to pt's e mail  $15 dollar suprep coupon to pt today in [place]

## 2016-10-01 ENCOUNTER — Encounter: Payer: Self-pay | Admitting: Gastroenterology

## 2016-10-07 ENCOUNTER — Telehealth: Payer: Self-pay

## 2016-10-07 NOTE — Telephone Encounter (Signed)
-----   Message from Lavonia Drafts, MD sent at 10/06/2016  7:16 PM EDT ----- Please call pt. Her Endo Bx was negative.  Thx, clh-S

## 2016-10-07 NOTE — Telephone Encounter (Signed)
Left message for patient to return call to office.  Laree Garron RNBSN 

## 2016-10-08 ENCOUNTER — Encounter: Payer: BC Managed Care – PPO | Admitting: Gastroenterology

## 2016-10-09 NOTE — Telephone Encounter (Signed)
Patient made aware that her Enodmetrial biopsy was within normal limits. Kathrene Alu RNBSN

## 2016-10-27 DIAGNOSIS — D485 Neoplasm of uncertain behavior of skin: Secondary | ICD-10-CM | POA: Diagnosis not present

## 2016-10-29 ENCOUNTER — Ambulatory Visit (INDEPENDENT_AMBULATORY_CARE_PROVIDER_SITE_OTHER): Payer: BC Managed Care – PPO | Admitting: Obstetrics & Gynecology

## 2016-10-29 ENCOUNTER — Encounter: Payer: Self-pay | Admitting: Obstetrics & Gynecology

## 2016-10-29 VITALS — BP 127/70 | HR 74 | Ht 61.0 in | Wt 178.0 lb

## 2016-10-29 DIAGNOSIS — Z30431 Encounter for routine checking of intrauterine contraceptive device: Secondary | ICD-10-CM | POA: Diagnosis not present

## 2016-10-29 NOTE — Progress Notes (Signed)
History:  51 y.o. G2P1011 here today for today for IUD string check; LnIUD was placed 09/29/2016. Pt reports cramping. She's been taking 400mg  of Motrin once a day.   LnIUD, no concerning side effects.  The following portions of the patient's history were reviewed and updated as appropriate: allergies, current medications, past family history, past medical history, past social history, past surgical history and problem list. Last pap smear on 08/18/2016 was normal, neg HRHPV.  Review of Systems:  Pertinent items are noted in HPI.   Objective:  Physical Exam Blood pressure 127/70, pulse 74, height 5\' 1"  (1.549 m), weight 178 lb (80.7 kg). Gen: NAD Abd: Soft, nontender and nondistended Pelvic: Normal appearing external genitalia; normal appearing vaginal mucosa and cervix.  IUD strings visualized, about 4 cm in length outside cervix.   Assessment & Plan:  Normal IUD check. Patient to keep IUD in place for five years; can come in for removal if she desires pregnancy within the next five years. Routine preventative health maintenance measures emphasized.  Pt worried about increased appetite for several months. Rec Weight [employer] and exercise F/u in 1 year or sooner prn.  Ica Daye L. Harraway-Smith, M.D., Cherlynn June

## 2016-11-17 ENCOUNTER — Telehealth: Payer: Self-pay | Admitting: Gastroenterology

## 2016-11-17 NOTE — Telephone Encounter (Signed)
ok 

## 2016-11-18 ENCOUNTER — Encounter: Payer: BC Managed Care – PPO | Admitting: Gastroenterology

## 2016-11-26 ENCOUNTER — Encounter: Payer: Self-pay | Admitting: Family Medicine

## 2016-11-26 ENCOUNTER — Ambulatory Visit (INDEPENDENT_AMBULATORY_CARE_PROVIDER_SITE_OTHER): Payer: BC Managed Care – PPO | Admitting: Family Medicine

## 2016-11-26 ENCOUNTER — Other Ambulatory Visit: Payer: Self-pay | Admitting: Family Medicine

## 2016-11-26 VITALS — BP 108/78 | HR 54 | Temp 98.6°F | Ht 61.0 in | Wt 175.4 lb

## 2016-11-26 DIAGNOSIS — Z23 Encounter for immunization: Secondary | ICD-10-CM

## 2016-11-26 DIAGNOSIS — E611 Iron deficiency: Secondary | ICD-10-CM

## 2016-11-26 DIAGNOSIS — E559 Vitamin D deficiency, unspecified: Secondary | ICD-10-CM

## 2016-11-26 DIAGNOSIS — Q181 Preauricular sinus and cyst: Secondary | ICD-10-CM

## 2016-11-26 LAB — IRON,TIBC AND FERRITIN PANEL
%SAT: 10 % — AB (ref 11–50)
Ferritin: 10 ng/mL (ref 10–232)
IRON: 40 ug/dL — AB (ref 45–160)
TIBC: 381 ug/dL (ref 250–450)

## 2016-11-26 LAB — FERRITIN: Ferritin: 10.1 ng/mL (ref 10.0–291.0)

## 2016-11-26 LAB — VITAMIN D 25 HYDROXY (VIT D DEFICIENCY, FRACTURES): VITD: 62.06 ng/mL (ref 30.00–100.00)

## 2016-11-26 NOTE — Patient Instructions (Addendum)
I will send you a message regarding your iron and Vit D levels by the end of the week.  Let us know if you need anything.

## 2016-11-26 NOTE — Addendum Note (Signed)
Addended by: Harl Bowie on: 11/26/2016 11:13 AM   Modules accepted: Orders

## 2016-11-26 NOTE — Progress Notes (Signed)
Chief Complaint  Patient presents with  . Cyst    right ear    Dawn Leach is a 51 y.o. female here for a skin complaint.  She has a cyst in front of her R ear that has been prsent over the past 30 years, had resolved on its own but came back 6 mo ago. It will intermittently hurt, sometimes drain. It does not itch or have any redness associated with it. Her plastic surgeon said she needs a biopsy/surgery, however this procedure will cost her nearly 2k out of pocket. She has an appt w a dermatologist, however needs a referral. She is also wondering if I can do this.   ROS:  Const: No fevers Skin: As noted in HPI  Past Medical History:  Diagnosis Date  . Allergy   . Anemia    No Known Allergies Allergies as of 11/26/2016   No Known Allergies     Medication List       Accurate as of 11/26/16 10:58 AM. Always use your most recent med list.          ADVIL 200 MG Caps Generic drug:  Ibuprofen Take 400 mg by mouth as needed.   ferrous sulfate 325 (65 FE) MG EC tablet Take 325 mg by mouth 2 (two) times daily.   GAS-X 80 MG chewable tablet Generic drug:  simethicone Chew 160 mg by mouth every 6 (six) hours as needed for flatulence.   multivitamin tablet Take 1 tablet by mouth daily.   OVER THE COUNTER MEDICATION Take 1 capsule by mouth daily. Nutrilife Slimmetry   Vitamin D3 5000 units Caps Take 5,000 Units by mouth daily.            Discharge Care Instructions        Start     Ordered   11/26/16 0000  Flu Vaccine QUAD 6+ mos PF IM (Fluarix Quad PF)     11/26/16 1021   11/26/16 0000  Ambulatory referral to Dermatology    Comments:  [facility]   11/26/16 1042      BP 108/78 (BP Location: Left Arm, Patient Position: Sitting, Cuff Size: Large)   Pulse (!) 54   Temp 98.6 F (37 C) (Oral)   Ht 5\' 1"  (1.549 m)   Wt 175 lb 6 oz (79.5 kg)   SpO2 98%   BMI 33.14 kg/m  Gen: awake, alert, appearing stated age Lungs: No accessory muscle use Skin:  preauricular area, there is a small opening with surrounding hyperpigmentation. No drainage, erythema, TTP, fluctuance, excoriation Psych: Age appropriate judgment and insight  Preauricular cyst - Plan: Ambulatory referral to Dermatology  Need for influenza vaccination - Plan: Flu Vaccine QUAD 6+ mos PF IM (Fluarix Quad PF)  Refer to derm. If they are unable to do anything satisfactory, will offer a biopsy here, but told her that I may have to send her back to her surgeon depending on how deep things go.  Check Vit D and Fe today, see if we can come off of 1000 IU Vit D.  F/u 3 mo for lab visit. The patient voiced understanding and agreement to the plan.  [place], DO 11/26/16 10:58 AM

## 2016-12-03 DIAGNOSIS — L720 Epidermal cyst: Secondary | ICD-10-CM | POA: Diagnosis not present

## 2016-12-30 NOTE — Telephone Encounter (Signed)
Completed.

## 2017-01-07 ENCOUNTER — Ambulatory Visit: Payer: BLUE CROSS/BLUE SHIELD | Admitting: Family Medicine

## 2017-01-07 ENCOUNTER — Encounter: Payer: Self-pay | Admitting: Family Medicine

## 2017-01-07 VITALS — BP 120/84 | HR 58 | Temp 98.3°F | Ht 61.0 in | Wt 180.0 lb

## 2017-01-07 DIAGNOSIS — R6889 Other general symptoms and signs: Secondary | ICD-10-CM

## 2017-01-07 NOTE — Progress Notes (Signed)
Chief Complaint  Patient presents with  . Cough    congestion    Marlis Edelson here for URI complaints.  Duration: 3 days Associated symptoms: sinus congestion, chest tightness, cough and sinus pain Denies: sinus pain, itchy watery eyes, ear pain, ear drainage, sore throat, shortness of breath, myalgia and fevers/rigors Treatment to date: Theraflu, Robitussin, alk-seltzer Sick contacts: No  Symptoms are improving overall, asking for Tamiflu  ROS:  Const: Denies fevers HEENT: As noted in HPI Lungs: No SOB  Past Medical History:  Diagnosis Date  . Allergy   . Anemia    Family History  Problem Relation Age of Onset  . Hypertension Father   . Diabetes Father   . Colon cancer Mother   . Cancer Mother   . Hypertension Mother   . Stroke Mother   . Colon polyps Neg Hx   . Rectal cancer Neg Hx   . Stomach cancer Neg Hx     BP 120/84 (BP Location: Left Arm, Patient Position: Sitting, Cuff Size: Normal)   Pulse (!) 58   Temp 98.3 F (36.8 C) (Oral)   Ht 5' 1"  (1.549 m)   Wt 180 lb (81.6 kg)   SpO2 97%   BMI 34.01 kg/m  General: Awake, alert, appears stated age HEENT: AT, [place], ears patent b/l and TM's neg, nares patent w/o discharge, pharynx pink and without exudates, MMM Neck: No masses or asymmetry Heart: RRR, no murmurs, no bruits Lungs: CTAB, no accessory muscle use Psych: Age appropriate judgment and insight, normal mood and affect  Flu-like symptoms  Orders as above. Discussed flu tx and how it would not be helpful.  Continue to push fluids, practice good hand hygiene, cover mouth when coughing. F/u prn. If starting to experience fevers, shaking, or shortness of breath, seek immediate care. Pt voiced understanding and agreement to the plan.  [place], DO 01/07/17 1:48 PM

## 2017-01-07 NOTE — Progress Notes (Signed)
Pre visit review using our clinic review tool, if applicable. No additional management support is needed unless otherwise documented below in the visit note. 

## 2017-01-07 NOTE — Patient Instructions (Addendum)
Continue to push fluids, practice good hand hygiene, and cover your mouth if you cough.  If you start having fevers, shaking or shortness of breath, seek immediate care.  

## 2017-01-09 ENCOUNTER — Other Ambulatory Visit: Payer: Self-pay

## 2017-01-09 ENCOUNTER — Encounter: Payer: Self-pay | Admitting: Gastroenterology

## 2017-01-09 ENCOUNTER — Telehealth: Payer: Self-pay | Admitting: Gastroenterology

## 2017-01-09 ENCOUNTER — Ambulatory Visit (AMBULATORY_SURGERY_CENTER): Payer: BLUE CROSS/BLUE SHIELD | Admitting: Gastroenterology

## 2017-01-09 VITALS — BP 107/64 | HR 53 | Temp 97.3°F | Resp 23 | Ht 61.0 in | Wt 174.0 lb

## 2017-01-09 DIAGNOSIS — Z800 Family history of malignant neoplasm of digestive organs: Secondary | ICD-10-CM | POA: Diagnosis present

## 2017-01-09 DIAGNOSIS — Z1211 Encounter for screening for malignant neoplasm of colon: Secondary | ICD-10-CM

## 2017-01-09 MED ORDER — SODIUM CHLORIDE 0.9 % IV SOLN: 500.0000 mL | INTRAVENOUS | Status: DC

## 2017-01-09 NOTE — Progress Notes (Signed)
Pt. Reports no change in her medical or surgical history since pre-visit 09/30/2016.

## 2017-01-09 NOTE — Telephone Encounter (Signed)
Dawn Eddy, RN called pt back.  She asked what ASA II meant on her procedure report.  Dawn Leach explained this was a rating for anesthesia.  Pt only had hx of anemia and allergies listed in her hx and that ASA II score supports that she can have her procedure in a ambulatory Outpatient Center. maw

## 2017-01-09 NOTE — Progress Notes (Signed)
Report to PACU, RN, vss, BBS= Clear.  

## 2017-01-09 NOTE — Patient Instructions (Addendum)
Impressions/recommendations:  Hemorrhoids (handout given)  YOU HAD AN ENDOSCOPIC PROCEDURE TODAY AT [facility]:   Refer to the procedure report that was given to you for any specific questions about what was found during the examination.  If the procedure report does not answer your questions, please call your gastroenterologist to clarify.  If you requested that your care partner not be given the details of your procedure findings, then the procedure report has been included in a sealed envelope for you to review at your convenience later.  YOU SHOULD EXPECT: Some feelings of bloating in the abdomen. Passage of more gas than usual.  Walking can help get rid of the air that was put into your GI tract during the procedure and reduce the bloating. If you had a lower endoscopy (such as a colonoscopy or flexible sigmoidoscopy) you may notice spotting of blood in your stool or on the toilet paper. If you underwent a bowel prep for your procedure, you may not have a normal bowel movement for a few days.  Please Note:  You might notice some irritation and congestion in your nose or some drainage.  This is from the oxygen used during your procedure.  There is no need for concern and it should clear up in a day or so.  SYMPTOMS TO REPORT IMMEDIATELY:   Following lower endoscopy (colonoscopy or flexible sigmoidoscopy):  Excessive amounts of blood in the stool  Significant tenderness or worsening of abdominal pains  Swelling of the abdomen that is new, acute  Fever of 100F or higher   For urgent or emergent issues, a gastroenterologist can be reached at any hour by calling [phone].   DIET:  We do recommend a small meal at first, but then you may proceed to your regular diet.  Drink plenty of fluids but you should avoid alcoholic beverages for 24 hours.  ACTIVITY:  You should plan to take it easy for the rest of today and you should NOT DRIVE or use heavy machinery until  tomorrow (because of the sedation medicines used during the test).    FOLLOW UP: Our staff will call the number listed on your records the next business day following your procedure to check on you and address any questions or concerns that you may have regarding the information given to you following your procedure. If we do not reach you, we will leave a message.  However, if you are feeling well and you are not experiencing any problems, there is no need to return our call.  We will assume that you have returned to your regular daily activities without incident.  If any biopsies were taken you will be contacted by phone or by letter within the next 1-3 weeks.  Please call us at [phone] if you have not heard about the biopsies in 3 weeks.    SIGNATURES/CONFIDENTIALITY: You and/or your care partner have signed paperwork which will be entered into your electronic medical record.  These signatures attest to the fact that that the information above on your After Visit Summary has been reviewed and is understood.  Full responsibility of the confidentiality of this discharge information lies with you and/or your care-partner.

## 2017-01-09 NOTE — Op Note (Addendum)
[facility] Patient Name: Dawn Leach Procedure Date: 01/09/2017 8:13 AM MRN: [id number] Endoscopist: Mauri Pole , MD Age: 51 Referring MD:  Date of Birth: [birth year] Gender: Female Account #: [id number] Procedure:                Colonoscopy Indications:              Colon cancer screening in patient at increased                            risk: Colorectal cancer in mother Medicines:                Monitored Anesthesia Care Procedure:                Pre-Anesthesia Assessment:                           - Prior to the procedure, a History and Physical                            was performed, and patient medications and                            allergies were reviewed. The patient's tolerance of                            previous anesthesia was also reviewed. The risks                            and benefits of the procedure and the sedation                            options and risks were discussed with the patient.                            All questions were answered, and informed consent                            was obtained. Prior Anticoagulants: The patient has                            taken no previous anticoagulant or antiplatelet                            agents. ASA Grade Assessment: II - A patient with                            mild systemic disease. After reviewing the risks                            and benefits, the patient was deemed in                            satisfactory condition to undergo the procedure.  After obtaining informed consent, the colonoscope                            was passed under direct vision. Throughout the                            procedure, the patient's blood pressure, pulse, and                            oxygen saturations were monitored continuously. The                            Model CF-HQ190L [id number]) scope was introduced                            through the anus and  advanced to the the cecum,                            identified by appendiceal orifice and ileocecal                            valve. The colonoscopy was performed without                            difficulty. The patient tolerated the procedure                            well. The quality of the bowel preparation was                            excellent. The ileocecal valve, appendiceal                            orifice, and rectum were photographed. Scope In: 8:14:47 AM Scope Out: 8:26:44 AM Scope Withdrawal Time: 0 hours 7 minutes 4 seconds  Total Procedure Duration: 0 hours 11 minutes 57 seconds  Findings:                 The perianal and digital rectal examinations were                            normal.                           Non-bleeding internal hemorrhoids were found during                            retroflexion. The hemorrhoids were small.                           The exam was otherwise without abnormality. Complications:            No immediate complications. Estimated Blood Loss:     Estimated blood loss: none. Impression:               - Non-bleeding internal hemorrhoids.                           -  The examination was otherwise normal.                           - No specimens collected. Recommendation:           - Patient has a contact number available for                            emergencies. The signs and symptoms of potential                            delayed complications were discussed with the                            patient. Return to normal activities tomorrow.                            Written discharge instructions were provided to the                            patient.                           - Resume previous diet.                           - Continue present medications.                           - Repeat colonoscopy in 5 years for screening                            purposes. Mauri Pole, MD 01/09/2017 8:34:06 AM This report has  been signed electronically.

## 2017-01-09 NOTE — Telephone Encounter (Signed)
Pt states that the doctor gave her some results and she would like a call from the doctor or nurse for that to be elaborated on. Pt had colon this morning @8am 

## 2017-01-12 ENCOUNTER — Telehealth: Payer: Self-pay

## 2017-01-12 NOTE — Telephone Encounter (Signed)
Name identifier, left a message. 

## 2017-01-12 NOTE — Telephone Encounter (Signed)
Called [phone] and left a messaged we tried to reach pt for a follow up call. maw

## 2017-04-07 ENCOUNTER — Ambulatory Visit: Payer: Self-pay | Admitting: *Deleted

## 2017-04-07 NOTE — Telephone Encounter (Signed)
Pt called because sustained a burn to her right foot when she dropped hot grease  on it Wednesday 04/01/16; nurse triage initiated and recommendations made per protocol; recommendation made for pt to see physician within 24 hours; pt requested appointment tomorrow afternoon after 1200; pt offered and accepted appointment with Dr Nani Ravens 04/08/17 at 1300; pt verbalizes understanding.     Reason for Disposition . [1] Looks infected (spreading redness, pus) AND [2] no fever  Answer Assessment - Initial Assessment Questions 1. ONSET: "When did it happen?" If happened < 10 minutes ago, ask: "Did you apply cold water?" If not, give First Aid Advice immediately.      Wednesday 04/01/16 2. LOCATION: "Where is the burn located?"      Right foot 3. BURN SIZE: "How large is the burn?"  The palm is roughly 1% of the total body surface area (BSA).     1% 4. SEVERITY OF THE BURN: "Are there any blisters?"      Only 1 and the pt popped it on 24/19 5. MECHANISM: "Tell me how it happened."     Pt dropped hot grease on it 6. PAIN: "Are you having any pain?" "How bad is the pain?" (Scale 1-10; or mild, moderate, severe)   - MILD (1-3): doesn't interfere with normal activities    - MODERATE (4-7): interferes with normal activities or awakens from sleep    - SEVERE (8-10): excruciating pain, unable to do any normal activities      mild 7. INHALATION INJURY: "Were you exposed to any smoke or fumes?" If yes: "Do you have any cough or difficulty breathing?"     no 8. OTHER SYMPTOMS: "Do you have any other symptoms?" (e.g., headache, nausea)     Area is painful and is draining from where she popped the blister 9. PREGNANCY: "Is there any chance you are pregnant?" "When was your last menstrual period?"     No IUD  Protocols used: BURNS - [facility]

## 2017-04-08 ENCOUNTER — Ambulatory Visit: Payer: BLUE CROSS/BLUE SHIELD | Admitting: Family Medicine

## 2017-04-13 ENCOUNTER — Ambulatory Visit: Payer: BLUE CROSS/BLUE SHIELD | Admitting: Family Medicine

## 2017-04-13 ENCOUNTER — Encounter: Payer: Self-pay | Admitting: Family Medicine

## 2017-04-13 VITALS — BP 116/78 | HR 70 | Temp 97.6°F | Ht 61.0 in | Wt 178.0 lb

## 2017-04-13 DIAGNOSIS — T300 Burn of unspecified body region, unspecified degree: Secondary | ICD-10-CM

## 2017-04-13 DIAGNOSIS — T25021A Burn of unspecified degree of right foot, initial encounter: Secondary | ICD-10-CM

## 2017-04-13 MED ORDER — SILVER SULFADIAZINE 1 % EX CREA: 1.0000 "application " | TOPICAL_CREAM | Freq: Every day | CUTANEOUS | 0 refills | Status: DC

## 2017-04-13 NOTE — Patient Instructions (Addendum)
Things to look out for: increasing pain not relieved by ibuprofen/acetaminophen, fevers, spreading redness, drainage of pus, or foul odor.  Use lotion over the darker area until it peels off. Use it as much as you want.   For scarring- scar cream or topical Vit E after the skin peels.  This will turn the corner over the next couple weeks.  OK to use Neosporin twice daily or the new cream (silvadene). If the rx med is too expensive, use the Neosporin.   Let us know if you need anything.

## 2017-04-13 NOTE — Progress Notes (Signed)
Chief Complaint  Patient presents with  . Burn    Pt states she dropped hot grease on her R foot resulting in a burn on Jan.33. Pt states she wants PCP to look at it     Dawn Leach is a 52 y.o. female here for a skin complaint.  Duration: 2 weeks Location: top of R foot Pruritic? No Painful? No Drainage? No Grease on top of foot Other associated symptoms: darkening of skin, scaling Therapies tried thus far: TAO  ROS:  Const: No fevers Skin: As noted in HPI  Past Medical History:  Diagnosis Date  . Allergy   . Anemia    No Known Allergies   BP 116/78   Pulse 70   Temp 97.6 F (36.4 C) (Oral)   Ht 5\' 1"  (1.549 m)   Wt 178 lb (80.7 kg)   LMP 03/27/2017 (Exact Date)   SpO2 97%   BMI 33.63 kg/m  Gen: awake, alert, appearing stated age Lungs: No accessory muscle use Skin: see below. No drainage, erythema, TTP, fluctuance, excoriation Psych: Age appropriate judgment and insight  Media Information     Burn - Plan: silver sulfADIAZINE (SILVADENE) 1 % cream  Orders as above. No s/s's of infection. Lotion. Small opening, Silvadene for that.  Warning s/s's verbalized and written.  F/u prn. The patient voiced understanding and agreement to the plan.  [place], DO 04/13/17 9:49 AM

## 2017-11-10 ENCOUNTER — Other Ambulatory Visit: Payer: Self-pay

## 2017-11-10 DIAGNOSIS — Z1231 Encounter for screening mammogram for malignant neoplasm of breast: Secondary | ICD-10-CM

## 2017-11-28 ENCOUNTER — Encounter (HOSPITAL_BASED_OUTPATIENT_CLINIC_OR_DEPARTMENT_OTHER): Payer: Self-pay

## 2017-11-28 ENCOUNTER — Ambulatory Visit (HOSPITAL_BASED_OUTPATIENT_CLINIC_OR_DEPARTMENT_OTHER)
Admission: RE | Admit: 2017-11-28 | Discharge: 2017-11-28 | Disposition: A | Discharge status: Home / Self Care | Payer: BLUE CROSS/BLUE SHIELD | Source: Ambulatory Visit | Attending: Obstetrics & Gynecology | Admitting: Obstetrics & Gynecology

## 2017-11-28 DIAGNOSIS — Z1231 Encounter for screening mammogram for malignant neoplasm of breast: Secondary | ICD-10-CM | POA: Insufficient documentation

## 2017-11-30 ENCOUNTER — Ambulatory Visit (INDEPENDENT_AMBULATORY_CARE_PROVIDER_SITE_OTHER): Payer: BLUE CROSS/BLUE SHIELD | Admitting: Obstetrics & Gynecology

## 2017-11-30 ENCOUNTER — Encounter: Payer: Self-pay | Admitting: Obstetrics & Gynecology

## 2017-11-30 VITALS — BP 110/65 | HR 82 | Ht 61.0 in | Wt 183.5 lb

## 2017-11-30 DIAGNOSIS — Z01419 Encounter for gynecological examination (general) (routine) without abnormal findings: Secondary | ICD-10-CM | POA: Diagnosis not present

## 2017-11-30 DIAGNOSIS — D219 Benign neoplasm of connective and other soft tissue, unspecified: Secondary | ICD-10-CM

## 2017-11-30 NOTE — Patient Instructions (Signed)
Exercising to Lose Weight Exercising can help you to lose weight. In order to lose weight through exercise, you need to do vigorous-intensity exercise. You can tell that you are exercising with vigorous intensity if you are breathing very hard and fast and cannot hold a conversation while exercising. Moderate-intensity exercise helps to maintain your current weight. You can tell that you are exercising at a moderate level if you have a higher heart rate and faster breathing, but you are still able to hold a conversation. How often should I exercise? Choose an activity that you enjoy and set realistic goals. Your health care provider can help you to make an activity plan that works for you. Exercise regularly as directed by your health care provider. This may include:  Doing resistance training twice each week, such as: ? Push-ups. ? Sit-ups. ? Lifting weights. ? Using resistance bands.  Doing a given intensity of exercise for a given amount of time. Choose from these options: ? 150 minutes of moderate-intensity exercise every week. ? 75 minutes of vigorous-intensity exercise every week. ? A mix of moderate-intensity and vigorous-intensity exercise every week.  Children, pregnant women, people who are out of shape, people who are overweight, and older adults may need to consult a health care provider for individual recommendations. If you have any sort of medical condition, be sure to consult your health care provider before starting a new exercise program. What are some activities that can help me to lose weight?  Walking at a rate of at least 4.5 miles an hour.  Jogging or running at a rate of 5 miles per hour.  Biking at a rate of at least 10 miles per hour.  Lap swimming.  Roller-skating or in-line skating.  Cross-country skiing.  Vigorous competitive sports, such as football, basketball, and soccer.  Jumping rope.  Aerobic dancing. How can I be more active in my day-to-day  activities?  Use the stairs instead of the elevator.  Take a walk during your lunch break.  If you drive, park your car farther away from work or school.  If you take public transportation, get off one stop early and walk the rest of the way.  Make all of your phone calls while standing up and walking around.  Get up, stretch, and walk around every 30 minutes throughout the day. What guidelines should I follow while exercising?  Do not exercise so much that you hurt yourself, feel dizzy, or get very short of breath.  Consult your health care provider prior to starting a new exercise program.  Wear comfortable clothes and shoes with good support.  Drink plenty of water while you exercise to prevent dehydration or heat stroke. Body water is lost during exercise and must be replaced.  Work out until you breathe faster and your heart beats faster. This information is not intended to replace advice given to you by your health care provider. Make sure you discuss any questions you have with your health care provider. Document Released: 03/22/2010 Document Revised: 07/26/2015 Document Reviewed: 07/21/2013 Elsevier Interactive Patient Education  2018 [employer]vier Inc.  

## 2017-11-30 NOTE — Progress Notes (Signed)
Subjective:     Dawn Leach is a 52 y.o. female here for a routine exam.  MHD:QQIW 5th . Pt reports that she is still having monthly cycles. They are lighter. Current complaints: pt denies problems.  Aug cycle was complicated with lots of cramping.    Gynecologic History Patient's last menstrual period was 11/05/2017. Contraception: abstinence, condoms and IUD Last Pap: 08/18/2016. Results were: normal Last mammogram: 11/28/2017. Results were: normal  Obstetric History OB History  Gravida Para Term Preterm AB Living  2 1 1  0 1 1  SAB TAB Ectopic Multiple Live Births  1 0 0 0 1    # Outcome Date GA Lbr Len/2nd Weight Sex Delivery Anes PTL Lv  2 SAB      SAB     1 Term      Vag-Spont   LIV     The following portions of the patient's history were reviewed and updated as appropriate: allergies, current medications, past family history, past medical history, past social history, past surgical history and problem list.  Review of Systems Pertinent items are noted in HPI.    Objective:  BP 110/65   Pulse 82   Ht 5\' 1"  (1.549 m)   Wt 183 lb 8 oz (83.2 kg)   LMP 11/05/2017   BMI 34.67 kg/m   General Appearance:    Alert, cooperative, no distress, appears stated age  Head:    Normocephalic, without obvious abnormality, atraumatic  Eyes:    conjunctiva/corneas clear, EOM's intact, both eyes  Ears:    Normal external ear canals, both ears  Nose:   Nares normal, septum midline, mucosa normal, no drainage    or sinus tenderness  Throat:   Lips, mucosa, and tongue normal; teeth and gums normal  Neck:   Supple, symmetrical, trachea midline, no adenopathy;    thyroid:  no enlargement/tenderness/nodules  Back:     Symmetric, no curvature, ROM normal, no CVA tenderness  Lungs:     Clear to auscultation bilaterally, respirations unlabored  Chest Wall:    No tenderness or deformity   Heart:    Regular rate and rhythm, S1 and S2 normal, no murmur, rub   or gallop  Breast Exam:    No  tenderness, masses, or nipple abnormality  Abdomen:     Soft, non-tender, bowel sounds active all four quadrants,    no masses, no organomegaly  Genitalia:    Normal female without lesion, discharge or tenderness; uterus enlarged 12 weeks sized; mobile.       Extremities:   Extremities normal, atraumatic, no cyanosis or edema  Pulses:   2+ and symmetric all extremities  Skin:   Skin color, texture, turgor normal, no rashes or lesions     Assessment:    Healthy female exam.   Uterine fibroids stable; asymptomatic.  Breast cancer screen- mammogram done earlier this week    Plan:    Follow up in: 1 year.    Year mammogram  Discussed weight management- weight training emphasized.     [place] Pilar L. Harraway-Smith, M.D., Cherlynn June

## 2018-02-01 ENCOUNTER — Ambulatory Visit (INDEPENDENT_AMBULATORY_CARE_PROVIDER_SITE_OTHER): Payer: BLUE CROSS/BLUE SHIELD

## 2018-02-01 DIAGNOSIS — Z23 Encounter for immunization: Secondary | ICD-10-CM

## 2018-02-01 NOTE — Progress Notes (Addendum)
Patient presents for flu shot. Valene Bors of Attending Supervision of RN: Evaluation and management procedures were performed by the nurse under my supervision and collaboration.  I have reviewed the nursing note and chart, and I agree with the management and plan.  Carolyn L. Harraway-Smith, M.D., Cherlynn June

## 2018-03-20 DIAGNOSIS — N390 Urinary tract infection, site not specified: Secondary | ICD-10-CM | POA: Diagnosis not present

## 2018-03-20 DIAGNOSIS — R1084 Generalized abdominal pain: Secondary | ICD-10-CM | POA: Diagnosis not present

## 2018-11-02 ENCOUNTER — Ambulatory Visit: Payer: BLUE CROSS/BLUE SHIELD | Admitting: Family Medicine

## 2018-11-09 ENCOUNTER — Other Ambulatory Visit: Payer: Self-pay

## 2018-11-09 ENCOUNTER — Ambulatory Visit (INDEPENDENT_AMBULATORY_CARE_PROVIDER_SITE_OTHER): Payer: BC Managed Care – PPO | Admitting: Family Medicine

## 2018-11-09 ENCOUNTER — Encounter: Payer: Self-pay | Admitting: Family Medicine

## 2018-11-09 VITALS — BP 132/72 | HR 70 | Temp 97.2°F | Ht 61.0 in | Wt 188.4 lb

## 2018-11-09 DIAGNOSIS — Z711 Person with feared health complaint in whom no diagnosis is made: Secondary | ICD-10-CM

## 2018-11-09 DIAGNOSIS — I8391 Asymptomatic varicose veins of right lower extremity: Secondary | ICD-10-CM

## 2018-11-09 DIAGNOSIS — Z09 Encounter for follow-up examination after completed treatment for conditions other than malignant neoplasm: Secondary | ICD-10-CM | POA: Diagnosis not present

## 2018-11-09 DIAGNOSIS — Z0000 Encounter for general adult medical examination without abnormal findings: Secondary | ICD-10-CM | POA: Diagnosis not present

## 2018-11-09 LAB — LIPID PANEL
Cholesterol: 208 mg/dL — ABNORMAL HIGH (ref 0–200)
HDL: 55.3 mg/dL (ref >39.00)
LDL Cholesterol: 129 mg/dL — ABNORMAL HIGH (ref 0–99)
NonHDL: 152.88
Total CHOL/HDL Ratio: 4
Triglycerides: 119 mg/dL (ref 0.0–149.0)
VLDL: 23.8 mg/dL (ref 0.0–40.0)

## 2018-11-09 LAB — CBC
HCT: 38.3 % (ref 36.0–46.0)
Hemoglobin: 12.7 g/dL (ref 12.0–15.0)
MCHC: 33.1 g/dL (ref 30.0–36.0)
MCV: 84.3 fl (ref 78.0–100.0)
Platelets: 288 10*3/uL (ref 150.0–400.0)
RBC: 4.54 Mil/uL (ref 3.87–5.11)
RDW: 15.8 % — ABNORMAL HIGH (ref 11.5–15.5)
WBC: 3.5 10*3/uL — ABNORMAL LOW (ref 4.0–10.5)

## 2018-11-09 LAB — COMPREHENSIVE METABOLIC PANEL
ALT: 13 U/L (ref 0–35)
AST: 14 U/L (ref 0–37)
Albumin: 4 g/dL (ref 3.5–5.2)
Alkaline Phosphatase: 93 U/L (ref 39–117)
BUN: 9 mg/dL (ref 6–23)
CO2: 27 mEq/L (ref 19–32)
Calcium: 9.9 mg/dL (ref 8.4–10.5)
Chloride: 104 mEq/L (ref 96–112)
Creatinine, Ser: 0.86 mg/dL (ref 0.40–1.20)
GFR: 83.49 mL/min (ref >60.00)
Glucose, Bld: 89 mg/dL (ref 70–99)
Potassium: 4.2 mEq/L (ref 3.5–5.1)
Sodium: 138 mEq/L (ref 135–145)
Total Bilirubin: 0.5 mg/dL (ref 0.2–1.2)
Total Protein: 7.2 g/dL (ref 6.0–8.3)

## 2018-11-09 NOTE — Patient Instructions (Addendum)
Give [place] 2-3 business days to get the results of your labs back.   I recommend getting the flu shot in mid October. This suggestion would change if the CDC comes out with a different recommendation.   Wear the compression stockings to help with the varicose veins.  Your clavicle/collar bones is OK.  Let us know if you need anything.

## 2018-11-09 NOTE — Progress Notes (Signed)
Chief Complaint  Patient presents with  . Foot Swelling    Dawn Leach here for bilateral leg swelling.  Had used a weight loss belt that caused swelling her legs. She stopped and it got better. No current swelling or calf pain. No CP or sob.   Would like to have her liver function checked.   Wondering if her R collar bone is more protrusive than it should be. No inj or change in activity. No neurologic s/s's. No skin lesions or bumps over either side.   ROS:  MSK- no current leg swelling Neuro- no weakness  Past Medical History:  Diagnosis Date  . Allergy   . Anemia    Family History  Problem Relation Age of Onset  . Hypertension Father   . Diabetes Father   . Colon cancer Mother   . Cancer Mother   . Hypertension Mother   . Stroke Mother   . Colon polyps Neg Hx   . Rectal cancer Neg Hx   . Stomach cancer Neg Hx    Past Surgical History:  Procedure Laterality Date  . NO PAST SURGERIES    . NSVD     x1    Current Outpatient Medications:  .  Cholecalciferol (VITAMIN D3) 5000 units CAPS, Take 5,000 Units by mouth daily., Disp: , Rfl:  .  ferrous sulfate 325 (65 FE) MG EC tablet, Take 325 mg by mouth 2 (two) times daily., Disp: , Rfl:  .  Ibuprofen (ADVIL) 200 MG CAPS, Take 400 mg by mouth as needed., Disp: , Rfl:  .  Multiple Vitamin (MULTIVITAMIN) tablet, Take 1 tablet by mouth daily., Disp: , Rfl:  .  OVER THE COUNTER MEDICATION, Take 1 capsule by mouth daily. Nutrilife Slimmetry, Disp: , Rfl:  .  silver sulfADIAZINE (SILVADENE) 1 % cream, Apply 1 application topically daily., Disp: 50 g, Rfl: 0 .  simethicone (GAS-X) 80 MG chewable tablet, Chew 160 mg by mouth every 6 (six) hours as needed for flatulence., Disp: , Rfl:   BP 132/72 (BP Location: Left Arm, Patient Position: Sitting, Cuff Size: Normal)   Pulse 70   Temp (!) 97.2 F (36.2 C) (Temporal)   Ht 5\' 1"  (1.549 m)   Wt 188 lb 6 oz (85.4 kg)   SpO2 98%   BMI 35.59 kg/m  Gen- awake, alert, appears  stated age Heart- RRR, no bruits, no LE edema Lungs- CTAB, normal effort w/o accessory muscle use MSK- no calf pain Skin- varicosities noted over prox lateral RLE, no ttp; no erythema, ttp, nodules noted over b/l clavicles Abd- S, NT, ND, BS+ Psych: Age appropriate judgment and insight  Asymptomatic varicose veins of right lower extremity  Well adult exam - Plan: CBC, Comprehensive metabolic panel, Lipid panel  Follow-up for resolved condition  Worried well  1- compression stockings.  2- LE edema improved, though stockings can likely help also. 3- Clavicle reassurance, I did not appreciate an LNs F/u in 5 weeks for CPE, labs today as she is fasting.  Pt voiced understanding and agreement to the plan.  [place], DO 11/09/18  10:33 AM

## 2018-11-11 ENCOUNTER — Other Ambulatory Visit (HOSPITAL_BASED_OUTPATIENT_CLINIC_OR_DEPARTMENT_OTHER): Payer: Self-pay | Admitting: Family Medicine

## 2018-11-11 DIAGNOSIS — Z1231 Encounter for screening mammogram for malignant neoplasm of breast: Secondary | ICD-10-CM

## 2018-12-13 ENCOUNTER — Encounter (HOSPITAL_BASED_OUTPATIENT_CLINIC_OR_DEPARTMENT_OTHER): Payer: BC Managed Care – PPO

## 2018-12-13 ENCOUNTER — Other Ambulatory Visit: Payer: Self-pay

## 2018-12-13 ENCOUNTER — Encounter: Payer: Self-pay | Admitting: Family Medicine

## 2018-12-13 ENCOUNTER — Ambulatory Visit (HOSPITAL_BASED_OUTPATIENT_CLINIC_OR_DEPARTMENT_OTHER): Payer: BC Managed Care – PPO

## 2018-12-13 ENCOUNTER — Ambulatory Visit (INDEPENDENT_AMBULATORY_CARE_PROVIDER_SITE_OTHER): Payer: BC Managed Care – PPO | Admitting: Family Medicine

## 2018-12-13 VITALS — BP 110/70 | HR 77 | Temp 97.4°F | Ht 61.0 in | Wt 191.1 lb

## 2018-12-13 DIAGNOSIS — Z0000 Encounter for general adult medical examination without abnormal findings: Secondary | ICD-10-CM

## 2018-12-13 DIAGNOSIS — Z23 Encounter for immunization: Secondary | ICD-10-CM

## 2018-12-13 NOTE — Progress Notes (Signed)
Chief Complaint  Patient presents with  . Annual Exam     Well Woman Dawn Leach is here for a complete physical.   Her last physical was >1 year ago.  Current diet: in general, a "healthy" diet. Current exercise: some wt resistance, cardio. Weight is stable overall and she denies daytime fatigue. Seatbelt? Yes  Health Maintenance Pap/HPV- Yes Mammogram- Yes Tetanus- Yes HIV screening- Yes  Colonoscopy- Yes  Past Medical History:  Diagnosis Date  . Allergy   . Anemia      Past Surgical History:  Procedure Laterality Date  . NO PAST SURGERIES    . NSVD     x1    Medications  Current Outpatient Medications on File Prior to Visit  Medication Sig Dispense Refill  . Cholecalciferol (VITAMIN D3) 5000 units CAPS Take 5,000 Units by mouth daily.    . ferrous sulfate 325 (65 FE) MG EC tablet Take 325 mg by mouth 2 (two) times daily.    . Ibuprofen (ADVIL) 200 MG CAPS Take 400 mg by mouth as needed.    . Multiple Vitamin (MULTIVITAMIN) tablet Take 1 tablet by mouth daily.    [name] OVER THE COUNTER MEDICATION Take 1 capsule by mouth daily. Nutrilife Slimmetry    . silver sulfADIAZINE (SILVADENE) 1 % cream Apply 1 application topically daily. 50 g 0  . simethicone (GAS-X) 80 MG chewable tablet Chew 160 mg by mouth every 6 (six) hours as needed for flatulence.     Allergies No Known Allergies  Review of Systems: Constitutional:  no unexpected weight changes Eye:  no recent significant change in vision Ear/Nose/Mouth/Throat:  Ears:  no tinnitus or vertigo and no recent change in hearing Nose/Mouth/Throat:  no complaints of nasal congestion, no sore throat Cardiovascular: no chest pain Respiratory: no shortness of breath  Gastrointestinal:  no abdominal pain, no change in bowel habits GU:  Female: negative for dysuria or pelvic pain Musculoskeletal/Extremities:  no pain of the joints Integumentary (Skin/Breast):  no abnormal skin lesions reported Neurologic:  no  headaches Endocrine:  denies fatigue Hematologic/Lymphatic:  No areas of easy bleeding  Exam BP 110/70 (BP Location: Left Arm, Patient Position: Sitting, Cuff Size: Normal)   Pulse 77   Temp (!) 97.4 F (36.3 C) (Temporal)   Ht 5\' 1"  (1.549 m)   Wt 191 lb 2 oz (86.7 kg)   SpO2 96%   BMI 36.11 kg/m  General:  well developed, well nourished, in no apparent distress Skin:  no significant moles, warts, or growths Head:  no masses, lesions, or tenderness Eyes:  pupils equal and round, sclera anicteric without injection Ears:  canals without lesions, TMs shiny without retraction, no obvious effusion, no erythema Nose:  nares patent, septum midline, mucosa normal, and no drainage or sinus tenderness Throat/Pharynx:  lips and gingiva without lesion; tongue and uvula midline; non-inflamed pharynx; no exudates or postnasal drainage Neck: neck supple without adenopathy, thyromegaly, or masses Lungs:  clear to auscultation, breath sounds equal bilaterally, no respiratory distress Cardio:  regular rate and rhythm, no bruits, no LE edema Abdomen:  abdomen soft, nontender; bowel sounds normal; no masses or organomegaly Genital: Defer to GYN Musculoskeletal:  symmetrical muscle groups noted without atrophy or deformity Extremities:  no clubbing, cyanosis, or edema, no deformities, no skin discoloration Neuro:  gait normal; deep tendon reflexes normal and symmetric Psych: well oriented with normal range of affect and appropriate judgment/insight  Assessment and Plan  Well adult exam  Need for influenza vaccination -  Plan: Flu Vaccine QUAD 6+ mos PF IM (Fluarix Quad PF)   Well 53 y.o. female. Counseled on diet and exercise. Went over her lab results.  Other orders as above. Follow up in 1 yr or prn. The patient voiced understanding and agreement to the plan.  [place], DO 12/13/18 10:03 AM

## 2018-12-13 NOTE — Patient Instructions (Addendum)
Keep the diet clean and stay active.  You are doing well if you are losing inches. Muscle weighs more than fat.   Take a side profile picture as well. Take monthly photos for both progress and motivation.    RDW is related to the low iron levels, which we knew about.  Let us know if you need anything.

## 2018-12-14 ENCOUNTER — Ambulatory Visit (HOSPITAL_BASED_OUTPATIENT_CLINIC_OR_DEPARTMENT_OTHER)
Admission: RE | Admit: 2018-12-14 | Discharge: 2018-12-14 | Disposition: A | Discharge status: Home / Self Care | Payer: BC Managed Care – PPO | Source: Ambulatory Visit | Attending: Family Medicine | Admitting: Family Medicine

## 2018-12-14 DIAGNOSIS — Z1231 Encounter for screening mammogram for malignant neoplasm of breast: Secondary | ICD-10-CM | POA: Insufficient documentation

## 2018-12-16 ENCOUNTER — Ambulatory Visit (HOSPITAL_BASED_OUTPATIENT_CLINIC_OR_DEPARTMENT_OTHER): Payer: BC Managed Care – PPO

## 2018-12-17 ENCOUNTER — Other Ambulatory Visit: Payer: Self-pay

## 2018-12-17 ENCOUNTER — Encounter: Payer: Self-pay | Admitting: Obstetrics & Gynecology

## 2018-12-17 ENCOUNTER — Ambulatory Visit (INDEPENDENT_AMBULATORY_CARE_PROVIDER_SITE_OTHER): Payer: BC Managed Care – PPO | Admitting: Obstetrics & Gynecology

## 2018-12-17 VITALS — BP 113/81 | HR 81 | Ht 61.0 in | Wt 192.0 lb

## 2018-12-17 DIAGNOSIS — Z124 Encounter for screening for malignant neoplasm of cervix: Secondary | ICD-10-CM

## 2018-12-17 DIAGNOSIS — Z113 Encounter for screening for infections with a predominantly sexual mode of transmission: Secondary | ICD-10-CM

## 2018-12-17 DIAGNOSIS — Z01419 Encounter for gynecological examination (general) (routine) without abnormal findings: Secondary | ICD-10-CM | POA: Diagnosis not present

## 2018-12-17 DIAGNOSIS — B373 Candidiasis of vulva and vagina: Secondary | ICD-10-CM

## 2018-12-17 DIAGNOSIS — T8332XA Displacement of intrauterine contraceptive device, initial encounter: Secondary | ICD-10-CM

## 2018-12-17 DIAGNOSIS — N898 Other specified noninflammatory disorders of vagina: Secondary | ICD-10-CM

## 2018-12-17 DIAGNOSIS — Z1151 Encounter for screening for human papillomavirus (HPV): Secondary | ICD-10-CM | POA: Diagnosis not present

## 2018-12-17 DIAGNOSIS — B3731 Acute candidiasis of vulva and vagina: Secondary | ICD-10-CM

## 2018-12-17 DIAGNOSIS — N930 Postcoital and contact bleeding: Secondary | ICD-10-CM

## 2018-12-17 NOTE — Progress Notes (Signed)
Subjective:     Dawn Leach is a 53 y.o. female here for a routine exam.  Current complaints: post coital bleeding.  Had partner who she noticed bleeding after intercourse. They wee monogamous but she had concernsnea r the end.    Gynecologic History No LMP recorded. (Menstrual status: IUD). Contraception: IUD Last Pap: 08/18/2016. Results were: normal Last mammogram: 12/14/2018. Results were: normal  Obstetric History OB History  Gravida Para Term Preterm AB Living  2 1 1  0 1 1  SAB TAB Ectopic Multiple Live Births  1 0 0 0 1    # Outcome Date GA Lbr Len/2nd Weight Sex Delivery Anes PTL Lv  2 SAB      SAB     1 Term      Vag-Spont   LIV     The following portions of the patient's history were reviewed and updated as appropriate: allergies, current medications, past family history, past medical history, past social history, past surgical history and problem list.  Review of Systems Pertinent items are noted in HPI.    Objective:  BP 113/81   Pulse 81   Ht 5\' 1"  (1.549 m)   Wt 192 lb (87.1 kg)   BMI 36.28 kg/m   General Appearance:    Alert, cooperative, no distress, appears stated age  Head:    Normocephalic, without obvious abnormality, atraumatic  Eyes:    conjunctiva/corneas clear, EOM's intact, both eyes  Ears:    Normal external ear canals, both ears  Nose:   Nares normal, septum midline, mucosa normal, no drainage    or sinus tenderness  Throat:   Lips, mucosa, and tongue normal; teeth and gums normal  Neck:   Supple, symmetrical, trachea midline, no adenopathy;    thyroid:  no enlargement/tenderness/nodules  Back:     Symmetric, no curvature, ROM normal, no CVA tenderness  Lungs:     respirations unlabored  Chest Wall:    No tenderness or deformity   Heart:    Regular rate and rhythm  Breast Exam:    No tenderness, masses, or nipple abnormality  Abdomen:     Soft, non-tender, bowel sounds active all four quadrants,    no masses, no organomegaly  Genitalia:     Normal female without lesion, discharge or tenderness   Fibroid uterus- unchanged; IUD strings NOT noted.    Extremities:   Extremities normal, atraumatic, no cyanosis or edema  Pulses:   2+ and symmetric all extremities  Skin:   Skin color, texture, turgor normal, no rashes or lesions    Assessment:    Healthy female exam.   STI screen IUD strings not seen  Breast cancer screen   Plan:   Dawn Leach was seen today for gynecologic exam.  Diagnoses and all orders for this visit:  Intrauterine contraceptive device threads lost, initial encounter -     US PELVIS TRANSVAGINAL NON-OB (TV ONLY); Future  PCB (post coital bleeding) -     US PELVIS TRANSVAGINAL NON-OB (TV ONLY); Future  Well female exam with routine gynecological exam -     US PELVIS TRANSVAGINAL NON-OB (TV ONLY); Future -     Cervicovaginal ancillary only( [place]) -     Cytology - PAP( [place])

## 2018-12-22 ENCOUNTER — Other Ambulatory Visit: Payer: Self-pay

## 2018-12-22 ENCOUNTER — Ambulatory Visit (HOSPITAL_BASED_OUTPATIENT_CLINIC_OR_DEPARTMENT_OTHER)
Admission: RE | Admit: 2018-12-22 | Discharge: 2018-12-22 | Disposition: A | Discharge status: Home / Self Care | Payer: BC Managed Care – PPO | Source: Ambulatory Visit | Attending: Obstetrics & Gynecology | Admitting: Obstetrics & Gynecology

## 2018-12-22 DIAGNOSIS — D259 Leiomyoma of uterus, unspecified: Secondary | ICD-10-CM | POA: Diagnosis not present

## 2018-12-22 DIAGNOSIS — T8332XA Displacement of intrauterine contraceptive device, initial encounter: Secondary | ICD-10-CM | POA: Diagnosis not present

## 2018-12-22 DIAGNOSIS — N930 Postcoital and contact bleeding: Secondary | ICD-10-CM | POA: Diagnosis not present

## 2018-12-22 DIAGNOSIS — Z01419 Encounter for gynecological examination (general) (routine) without abnormal findings: Secondary | ICD-10-CM

## 2018-12-23 LAB — CERVICOVAGINAL ANCILLARY ONLY
Bacterial Vaginitis (gardnerella): NEGATIVE
Candida Glabrata: POSITIVE — AB
Candida Vaginitis: NEGATIVE
Chlamydia: NEGATIVE
Comment: NEGATIVE
Comment: NEGATIVE
Comment: NEGATIVE
Comment: NEGATIVE
Comment: NORMAL
Neisseria Gonorrhea: NEGATIVE

## 2018-12-24 ENCOUNTER — Other Ambulatory Visit: Payer: Self-pay | Admitting: Obstetrics & Gynecology

## 2018-12-24 LAB — CYTOLOGY - PAP
Comment: NEGATIVE
Diagnosis: NEGATIVE
High risk HPV: NEGATIVE

## 2018-12-24 MED ORDER — FLUCONAZOLE 150 MG PO TABS: 150.0000 mg | ORAL_TABLET | Freq: Once | ORAL | 1 refills | Status: AC

## 2018-12-24 NOTE — Addendum Note (Signed)
Addended by: Lavonia Drafts on: 12/24/2018 09:11 AM   Modules accepted: Orders

## 2018-12-27 ENCOUNTER — Ambulatory Visit: Payer: BC Managed Care – PPO | Admitting: Obstetrics & Gynecology

## 2019-03-25 ENCOUNTER — Other Ambulatory Visit: Payer: Self-pay

## 2019-03-25 ENCOUNTER — Ambulatory Visit: Payer: BC Managed Care – PPO | Admitting: Medical

## 2019-03-28 ENCOUNTER — Other Ambulatory Visit: Payer: Self-pay

## 2019-03-28 ENCOUNTER — Ambulatory Visit (INDEPENDENT_AMBULATORY_CARE_PROVIDER_SITE_OTHER): Payer: BC Managed Care – PPO | Admitting: Medical

## 2019-03-28 ENCOUNTER — Encounter: Payer: Self-pay | Admitting: Medical

## 2019-03-28 VITALS — BP 108/76 | HR 71 | Temp 96.6°F | Resp 18 | Ht 61.0 in | Wt 190.0 lb

## 2019-03-28 DIAGNOSIS — T280XXA Burn of mouth and pharynx, initial encounter: Secondary | ICD-10-CM

## 2019-03-28 MED ORDER — MAGIC MOUTHWASH: ORAL | 0 refills | Status: DC

## 2019-03-28 MED FILL — MAGIC MW (MAAL,LIDO,BEN): 10 days supply | Qty: 200 | Fill #0

## 2019-03-28 NOTE — Progress Notes (Signed)
   Subjective:    Patient ID: Dawn Leach, female    DOB: [birth year], 54 y.o.   MRN: [id number]  HPI Pt in for evaluation.  She ate a jamaican beef patty.  She states at hot sandwich Tuesday a week ago. Pt states the area is not getting better. She states severe pain at time of eating. Then she rubbed area with baking soda. She states felts some tissue coming off.  No problems swallowing or shortness.     Review of Systems  Constitutional: Negative for chills, fatigue and fever.  HENT: Negative for congestion, drooling, ear pain, postnasal drip, sinus pressure, sinus pain, sore throat and tinnitus.        Rt side of palate. 10 mm x 7 mm size burn area. Other portion of tissue appears wrinkled on rt side adjacent to upper teeth.  Respiratory: Negative for cough, chest tightness, shortness of breath and wheezing.   Cardiovascular: Negative for chest pain and palpitations.  Gastrointestinal: Negative for abdominal pain.  Musculoskeletal: Negative for back pain and gait problem.  Skin: Negative for rash.  Neurological: Negative for dizziness and headaches.  Hematological: Negative for adenopathy. Does not bruise/bleed easily.  Psychiatric/Behavioral: Negative for behavioral problems and confusion.       Objective:   Physical Exam  General  Mental Status - Alert. General Appearance - Well groomed. Not in acute distress.  Skin Rashes- No Rashes.  HEENT  Nose & Sinuses Nasal Mucosa- Left-  Boggy and Congested. Right-  Boggy and  Congested.Bilateral maxillary and frontal sinus pressure. Mouth & Throat Lips: Upper Lip- Normal: no dryness, cracking, pallor, cyanosis, or vesicular eruption. Lower Lip-Normal: no dryness, cracking, pallor, cyanosis or vesicular eruption. Buccal Mucosa- Bilateral- No Aphthous ulcers. Oropharynx- No Discharge or Erythema. Tonsils: Characteristics- Bilateral- No Erythema or Congestion. Size/Enlargement- Bilateral- No enlargement. Discharge-  bilateral-None.   Chest and Lung Exam Auscultation: Breath Sounds:-Clear even and unlabored.  Cardiovascular Auscultation:Rythm- Regular, rate and rhythm. Murmurs & Other Heart Sounds:Ausculatation of the heart reveal- No Murmurs.  Lymphatic Head & Neck General Head & Neck Lymphatics: Bilateral: Description- No Localized lymphadenopathy.       Assessment & Plan:  For your mouth burn I am prescribing magic mouth every 6 hours swish and spit.  Advise to watch diet avoid salty, crunchy hot and warm foods.  Watch area and if any yellow discharge or severe swelling then will give antibiotic but want to avoid that presently as don't want you to get thrush on top of burn wound.  Follow up 7 days or as needed.  Mackie Pai, PA-C

## 2019-03-28 NOTE — Patient Instructions (Signed)
For your mouth burn I am prescribing magic mouth every 6 hours swish and spit.  Advise to watch diet avoid salty, crunchy, hot and warm foods.  Watch area and if any yellow discharge or severe swelling then will give antibiotic but want to avoid that presently as don't want you to get thrush on top of burn wound.  Follow up 7 days or as needed.

## 2019-05-06 ENCOUNTER — Ambulatory Visit: Payer: BC Managed Care – PPO | Attending: Internal Medicine

## 2019-05-06 DIAGNOSIS — Z23 Encounter for immunization: Secondary | ICD-10-CM | POA: Insufficient documentation

## 2019-05-06 NOTE — Progress Notes (Signed)
   Covid-19 Vaccination Clinic  Name:  Dawn Leach    MRN: [id number] DOB: [birth year]  05/06/2019  Dawn Leach was observed post Covid-19 immunization for 15 minutes without incident. She was provided with Vaccine Information Sheet and instruction to access the V-Safe system.   Dawn Leach was instructed to call 911 with any severe reactions post vaccine: [name] Difficulty breathing  . Swelling of face and throat  . A fast heartbeat  . A bad rash all over body  . Dizziness and weakness

## 2019-06-01 ENCOUNTER — Ambulatory Visit: Payer: BC Managed Care – PPO | Attending: Internal Medicine

## 2019-06-01 DIAGNOSIS — Z23 Encounter for immunization: Secondary | ICD-10-CM

## 2019-06-01 NOTE — Progress Notes (Signed)
   Covid-19 Vaccination Clinic  Name:  Dawn Leach    MRN: [id number] DOB: 1966-02-05  06/01/2019  Ms. Dawn Leach was observed post Covid-19 immunization for 15 minutes without incident. She was provided with Vaccine Information Sheet and instruction to access the V-Safe system.   Ms. Dawn Leach was instructed to call 911 with any severe reactions post vaccine: [name] Difficulty breathing  . Swelling of face and throat  . A fast heartbeat  . A bad rash all over body  . Dizziness and weakness   Immunizations Administered    Name Date Dose VIS Date Route   Pfizer COVID-19 Vaccine 06/01/2019  9:36 AM 0.3 mL 02/11/2019 Intramuscular   Manufacturer: [place]   Lot: [id number]   [place]: [id number]

## 2019-08-18 DIAGNOSIS — H5711 Ocular pain, right eye: Secondary | ICD-10-CM | POA: Diagnosis not present

## 2019-11-16 ENCOUNTER — Other Ambulatory Visit (HOSPITAL_BASED_OUTPATIENT_CLINIC_OR_DEPARTMENT_OTHER): Payer: Self-pay | Admitting: Family Medicine

## 2019-11-16 DIAGNOSIS — Z1231 Encounter for screening mammogram for malignant neoplasm of breast: Secondary | ICD-10-CM

## 2019-11-22 ENCOUNTER — Encounter (HOSPITAL_BASED_OUTPATIENT_CLINIC_OR_DEPARTMENT_OTHER): Payer: BC Managed Care – PPO

## 2019-11-29 ENCOUNTER — Ambulatory Visit: Payer: BC Managed Care – PPO | Admitting: Family Medicine

## 2019-11-30 ENCOUNTER — Ambulatory Visit (INDEPENDENT_AMBULATORY_CARE_PROVIDER_SITE_OTHER): Payer: BC Managed Care – PPO | Admitting: Family Medicine

## 2019-11-30 ENCOUNTER — Encounter: Payer: Self-pay | Admitting: Family Medicine

## 2019-11-30 ENCOUNTER — Other Ambulatory Visit: Payer: Self-pay

## 2019-11-30 VITALS — BP 132/82 | HR 64 | Temp 98.2°F | Ht 62.0 in | Wt 199.0 lb

## 2019-11-30 DIAGNOSIS — Z6836 Body mass index (BMI) 36.0-36.9, adult: Secondary | ICD-10-CM | POA: Diagnosis not present

## 2019-11-30 DIAGNOSIS — E6609 Other obesity due to excess calories: Secondary | ICD-10-CM | POA: Diagnosis not present

## 2019-11-30 DIAGNOSIS — Z23 Encounter for immunization: Secondary | ICD-10-CM

## 2019-11-30 NOTE — Patient Instructions (Addendum)
Try to get 110 g of protein daily. Try to aim for 1200-1300 calories daily.   Look for the protein on your label.   We need to add weight resistance exercise to your regimen.   If you do not hear anything about your referral in the next 1-2 weeks, call our office and ask for an update.   Wear your compression stockings daily for your veins.   Let us know if you need anything.

## 2019-11-30 NOTE — Progress Notes (Signed)
Chief Complaint  Patient presents with  . Follow-up    Subjective: Patient is a 54 y.o. female here for weight loss.  Pt has been gaining weight.   Past Medical History:  Diagnosis Date  . Allergy   . Anemia    Objective: BP 132/82 (BP Location: Right Arm, Patient Position: Sitting, Cuff Size: Normal)   Pulse 64   Temp 98.2 F (36.8 C) (Oral)   Ht 5\' 2"  (1.575 m)   Wt 199 lb (90.3 kg)   SpO2 99%   BMI 36.40 kg/m  General: Awake, appears stated age HEENT: MMM, EOMi Heart: RRR, no murmurs Lungs: CTAB, no rales, wheezes or rhonchi. No accessory muscle use Psych: Age appropriate judgment and insight, normal affect and mood  Assessment and Plan: Class 2 obesity due to excess calories without serious comorbidity with body mass index (BMI) of 36.0 to 36.9 in adult - Plan: Amb Ref to Medical Weight Management  Need for influenza vaccination - Plan: Flu Vaccine QUAD 6+ mos PF IM (Fluarix Quad PF)  Refer to [facility]. Counseled on diet/exercise. Needs to increase wt resistance exercise.   F/u in 6 weeks for CPE.  The patient voiced understanding and agreement to the plan.  [place], DO 11/30/19  4:28 PM

## 2019-12-05 ENCOUNTER — Other Ambulatory Visit (HOSPITAL_BASED_OUTPATIENT_CLINIC_OR_DEPARTMENT_OTHER): Payer: Self-pay | Admitting: Family Medicine

## 2019-12-05 DIAGNOSIS — Z1231 Encounter for screening mammogram for malignant neoplasm of breast: Secondary | ICD-10-CM

## 2019-12-20 ENCOUNTER — Other Ambulatory Visit: Payer: Self-pay

## 2019-12-20 ENCOUNTER — Ambulatory Visit (HOSPITAL_BASED_OUTPATIENT_CLINIC_OR_DEPARTMENT_OTHER)
Admission: RE | Admit: 2019-12-20 | Discharge: 2019-12-20 | Disposition: A | Discharge status: Home / Self Care | Payer: BC Managed Care – PPO | Source: Ambulatory Visit | Attending: Family Medicine | Admitting: Family Medicine

## 2019-12-20 DIAGNOSIS — Z1231 Encounter for screening mammogram for malignant neoplasm of breast: Secondary | ICD-10-CM

## 2019-12-21 ENCOUNTER — Ambulatory Visit (INDEPENDENT_AMBULATORY_CARE_PROVIDER_SITE_OTHER): Payer: BC Managed Care – PPO | Admitting: Bariatrics

## 2019-12-21 ENCOUNTER — Encounter (INDEPENDENT_AMBULATORY_CARE_PROVIDER_SITE_OTHER): Payer: Self-pay | Admitting: Bariatrics

## 2019-12-21 VITALS — BP 123/79 | HR 55 | Temp 98.1°F | Ht 62.0 in | Wt 197.0 lb

## 2019-12-21 DIAGNOSIS — E6609 Other obesity due to excess calories: Secondary | ICD-10-CM

## 2019-12-21 DIAGNOSIS — E559 Vitamin D deficiency, unspecified: Secondary | ICD-10-CM

## 2019-12-21 DIAGNOSIS — Z0289 Encounter for other administrative examinations: Secondary | ICD-10-CM

## 2019-12-21 DIAGNOSIS — R0602 Shortness of breath: Secondary | ICD-10-CM

## 2019-12-21 DIAGNOSIS — R5383 Other fatigue: Secondary | ICD-10-CM | POA: Diagnosis not present

## 2019-12-21 DIAGNOSIS — I83899 Varicose veins of unspecified lower extremities with other complications: Secondary | ICD-10-CM

## 2019-12-21 DIAGNOSIS — Z1331 Encounter for screening for depression: Secondary | ICD-10-CM | POA: Diagnosis not present

## 2019-12-21 DIAGNOSIS — R7309 Other abnormal glucose: Secondary | ICD-10-CM

## 2019-12-21 DIAGNOSIS — Z6836 Body mass index (BMI) 36.0-36.9, adult: Secondary | ICD-10-CM

## 2019-12-21 DIAGNOSIS — Z9189 Other specified personal risk factors, not elsewhere classified: Secondary | ICD-10-CM

## 2019-12-22 ENCOUNTER — Encounter (INDEPENDENT_AMBULATORY_CARE_PROVIDER_SITE_OTHER): Payer: Self-pay | Admitting: Bariatrics

## 2019-12-22 DIAGNOSIS — R7303 Prediabetes: Secondary | ICD-10-CM | POA: Insufficient documentation

## 2019-12-22 LAB — COMPREHENSIVE METABOLIC PANEL
ALT: 20 IU/L (ref 0–32)
AST: 25 IU/L (ref 0–40)
Albumin/Globulin Ratio: 1.3 (ref 1.2–2.2)
Albumin: 4.4 g/dL (ref 3.8–4.9)
Alkaline Phosphatase: 116 IU/L (ref 44–121)
BUN/Creatinine Ratio: 12 (ref 9–23)
BUN: 10 mg/dL (ref 6–24)
Bilirubin Total: 0.4 mg/dL (ref 0.0–1.2)
CO2: 20 mmol/L (ref 20–29)
Calcium: 9.9 mg/dL (ref 8.7–10.2)
Chloride: 104 mmol/L (ref 96–106)
Creatinine, Ser: 0.84 mg/dL (ref 0.57–1.00)
GFR calc Af Amer: 91 mL/min/{1.73_m2} (ref >59)
GFR calc non Af Amer: 79 mL/min/{1.73_m2} (ref >59)
Globulin, Total: 3.3 g/dL (ref 1.5–4.5)
Glucose: 83 mg/dL (ref 65–99)
Potassium: 4.8 mmol/L (ref 3.5–5.2)
Sodium: 139 mmol/L (ref 134–144)
Total Protein: 7.7 g/dL (ref 6.0–8.5)

## 2019-12-22 LAB — INSULIN, RANDOM: INSULIN: 8.8 u[IU]/mL (ref 2.6–24.9)

## 2019-12-22 LAB — TSH: TSH: 0.954 u[IU]/mL (ref 0.450–4.500)

## 2019-12-22 LAB — T4, FREE: Free T4: 0.98 ng/dL (ref 0.82–1.77)

## 2019-12-22 LAB — LIPID PANEL WITH LDL/HDL RATIO
Cholesterol, Total: 218 mg/dL — ABNORMAL HIGH (ref 100–199)
HDL: 66 mg/dL (ref >39)
LDL Chol Calc (NIH): 137 mg/dL — ABNORMAL HIGH (ref 0–99)
LDL/HDL Ratio: 2.1 ratio (ref 0.0–3.2)
Triglycerides: 85 mg/dL (ref 0–149)
VLDL Cholesterol Cal: 15 mg/dL (ref 5–40)

## 2019-12-22 LAB — HEMOGLOBIN A1C
Est. average glucose Bld gHb Est-mCnc: 126 mg/dL
Hgb A1c MFr Bld: 6 % — ABNORMAL HIGH (ref 4.8–5.6)

## 2019-12-22 LAB — VITAMIN D 25 HYDROXY (VIT D DEFICIENCY, FRACTURES): Vit D, 25-Hydroxy: 37.1 ng/mL (ref 30.0–100.0)

## 2019-12-22 LAB — T3: T3, Total: 76 ng/dL (ref 71–180)

## 2019-12-22 NOTE — Progress Notes (Signed)
Dear Dr. Riki Sheer,   Thank you for referring Dawn Leach to our clinic. The following note includes my evaluation and treatment recommendations.  Chief Complaint:   OBESITY Dawn Leach (MR# [id number]) is a 54 y.o. female who presents for evaluation and treatment of obesity and related comorbidities. Current BMI is Body mass index is 36.03 kg/m.Dawn Leach Dawn Leach has been struggling with Dawn Leach weight for many years and has been unsuccessful in either losing weight, maintaining weight loss, or reaching Dawn Leach healthy weight goal.  Dawn Leach is currently in the action stage of change and ready to dedicate time achieving and maintaining a healthier weight. Dawn Leach is interested in becoming our patient and working on intensive lifestyle modifications including (but not limited to) diet and exercise for weight loss.  Dawn Leach is lactose intolerant. She likes to cook, but works long hours. She craves sweets.  Dawn Leach's habits were reviewed today and are as follows: Dawn Leach family eats meals together, she thinks Dawn Leach family will eat healthier with Dawn Leach, Dawn Leach desired weight loss is 52 lbs, she started gaining weight in 2017, Dawn Leach heaviest weight ever was 197 pounds, she craves sweets and tacos, she snacks frequently in the evenings, she skips breakfast most days, and she is frequently drinking liquids with calories.  Depression Screen Anyia's Food and Mood (modified PHQ-9) score was 2.  Depression screen PHQ 2/9 12/21/2019  Decreased Interest 0  Down, Depressed, Hopeless 1  PHQ - 2 Score 1  Altered sleeping 0  Tired, decreased energy 0  Change in appetite 0  Feeling bad or failure about yourself  1  Trouble concentrating 0  Moving slowly or fidgety/restless 0  Suicidal thoughts 0  PHQ-9 Score 2  Difficult doing work/chores Extremely dIfficult   Subjective:   Other fatigue. Sallee denies daytime somnolence and denies waking up still tired. Aydan generally gets 6.5 hours of sleep per night, and states that she has generally  restful sleep. Snoring is present. Apneic episodes are not present. Epworth Sleepiness Score is 5.  SOB (shortness of breath) on exertion. Mabeline notes increasing shortness of breath with exercising and seems to be worsening over time with weight gain. She notes getting out of breath sooner with activity than she used to. This has not gotten worse recently. Eva denies shortness of breath at rest or orthopnea.  Vitamin D deficiency. Dawn Leach is taking Vitamin D supplementation.   Varicose veins of lower extremity with other complication, unspecified laterality. Dawn Leach reports varicose veins are not interfering with Dawn Leach activities.  Elevated glucose. Dawn Leach denies polyphagia.  Depression screening. Dawn Leach had a negative depression screen with a PHQ-9 score of 2.  At risk for activity intolerance. Dawn Leach is at risk of exercise intolerance due to obesity and varicose veins.  Assessment/Plan:   Other fatigue. Dawn Leach does not feel that Dawn Leach weight is causing Dawn Leach energy to be lower than it should be. Fatigue may be related to obesity, depression or many other causes. Labs will be ordered, and in the meanwhile, [place] will focus on self care including making healthy food choices, increasing physical activity and focusing on stress reduction. EKG 12-Lead, Lipid Panel With LDL/HDL Ratio  SOB (shortness of breath) on exertion. Dawn Leach does feel that she gets out of breath more easily that she used to when she exercises. Dawn Leach's shortness of breath appears to be obesity related and exercise induced. She has agreed to work on weight loss and gradually increase exercise to treat Dawn Leach exercise induced shortness of breath. Will continue  to monitor closely. Hemoglobin A1c, Insulin, random, Comprehensive metabolic panel, Lipid Panel With LDL/HDL Ratio, T3, T4, free, TSH, VITAMIN D 25 Hydroxy (Vit-D Deficiency, Fractures)  Vitamin D deficiency. Low Vitamin D level contributes to fatigue and are associated with obesity, breast, and colon cancer. She  agrees to continue to take Vitamin D as directed and Vitamin D level will be checked today.  Varicose veins of lower extremity with other complication, unspecified laterality. Dawn Leach was recommended to wear support hose.  Elevated glucose. A1c and insulin levels will be checked today.  Depression screening. Depression screening was negative.  At risk for activity intolerance. Dawn Leach was given approximately 15 minutes of exercise intolerance counseling today. She is 54 y.o. female and has risk factors exercise intolerance including obesity. We discussed intensive lifestyle modifications today with an emphasis on specific weight loss instructions and strategies. Dawn Leach will slowly increase activity as tolerated.  Repetitive spaced learning was employed today to elicit superior memory formation and behavioral change.  Class 2 obesity due to excess calories without serious comorbidity with body mass index (BMI) of 36.0 to 36.9 in adult.  Dawn Leach is currently in the action stage of change and Dawn Leach goal is to continue with weight loss efforts. I recommend Dawn Leach begin the structured treatment plan as follows:  She has agreed to the Category 1 Plan.  She will work on meal planning.  We reviewed with the patient labs from 08/22/2018 including CMP, lipids, CBC, glucose, and TSH.  Exercise goals: Dawn Leach will continue exercising.   Behavioral modification strategies: increasing lean protein intake, decreasing simple carbohydrates, increasing vegetables, increasing water intake, decreasing eating out, no skipping meals, meal planning and cooking strategies, keeping healthy foods in the home and planning for success.  She was informed of the importance of frequent follow-up visits to maximize Dawn Leach success with intensive lifestyle modifications for Dawn Leach multiple health conditions. She was informed we would discuss Dawn Leach lab results at Dawn Leach next visit unless there is a critical issue that needs to be addressed sooner. Dawn Leach agreed  to keep Dawn Leach next visit at the agreed upon time to discuss these results.  Objective:   Blood pressure 123/79, pulse (!) 55, temperature 98.1 F (36.7 C), height 5\' 2"  (1.575 m), weight 197 lb (89.4 kg), SpO2 98 %. Body mass index is 36.03 kg/m.  EKG: Sinus  Bradycardia with a rate of 56 BPM. Poor R wave progression which may be related  to body habitus. Otherwise normal.   Indirect Calorimeter completed today shows a VO2 of 182 and a REE of 1268.  Dawn Leach calculated basal metabolic rate is 5427 thus Dawn Leach basal metabolic rate is worse than expected.  General: Cooperative, alert, well developed, in no acute distress. HEENT: Conjunctivae and lids unremarkable. Cardiovascular: Regular rhythm.  Lungs: Normal work of breathing. Neurologic: No focal deficits.   Lab Results  Component Value Date   WBC 3.5 (L) 11/09/2018   HGB 12.7 11/09/2018   HCT 38.3 11/09/2018   MCV 84.3 11/09/2018   PLT 288.0 11/09/2018   Lab Results  Component Value Date   IRON 40 (L) 11/26/2016   TIBC 381 11/26/2016   FERRITIN 10.1 11/26/2016   FERRITIN 10 11/26/2016   Attestation Statements:   Reviewed by clinician on day of visit: allergies, medications, problem list, medical history, surgical history, family history, social history, and previous encounter notes.  Migdalia Dk, am acting as Location manager for [employer], DO   I have reviewed the above documentation for accuracy and completeness,  and I agree with the above. -Jearld Lesch, DO

## 2019-12-31 DIAGNOSIS — Z6836 Body mass index (BMI) 36.0-36.9, adult: Secondary | ICD-10-CM | POA: Insufficient documentation

## 2019-12-31 DIAGNOSIS — E6609 Other obesity due to excess calories: Secondary | ICD-10-CM | POA: Insufficient documentation

## 2020-01-04 ENCOUNTER — Ambulatory Visit (INDEPENDENT_AMBULATORY_CARE_PROVIDER_SITE_OTHER): Payer: BC Managed Care – PPO | Admitting: Bariatrics

## 2020-01-11 ENCOUNTER — Other Ambulatory Visit (HOSPITAL_COMMUNITY)
Admission: RE | Admit: 2020-01-11 | Discharge: 2020-01-11 | Disposition: A | Discharge status: Home / Self Care | Payer: BC Managed Care – PPO | Source: Ambulatory Visit | Attending: Obstetrics & Gynecology | Admitting: Obstetrics & Gynecology

## 2020-01-11 ENCOUNTER — Ambulatory Visit (INDEPENDENT_AMBULATORY_CARE_PROVIDER_SITE_OTHER): Payer: BC Managed Care – PPO | Admitting: Obstetrics & Gynecology

## 2020-01-11 ENCOUNTER — Encounter: Payer: Self-pay | Admitting: Obstetrics & Gynecology

## 2020-01-11 ENCOUNTER — Other Ambulatory Visit: Payer: Self-pay

## 2020-01-11 VITALS — BP 103/62 | HR 80 | Ht 62.0 in | Wt 194.0 lb

## 2020-01-11 DIAGNOSIS — Z01419 Encounter for gynecological examination (general) (routine) without abnormal findings: Secondary | ICD-10-CM | POA: Diagnosis not present

## 2020-01-11 DIAGNOSIS — N939 Abnormal uterine and vaginal bleeding, unspecified: Secondary | ICD-10-CM

## 2020-01-11 NOTE — Progress Notes (Signed)
Subjective:     Dawn Leach is a 54 y.o. female here for a routine exam.  Current complaints: pt reports continued spotting with strenuous exercise. She had her last full menses 1 year prev. Pt reports that she recently lost her job. She is about to begin the process of looking for a new position.   Gynecologic History No LMP recorded. (Menstrual status: IUD). Contraception: abstinence Last Pap: 12/2018. Results were: normal Last mammogram: 12/2018. Results were: normal  Obstetric History OB History  Gravida Para Term Preterm AB Living  2 1 1  0 1 1  SAB TAB Ectopic Multiple Live Births  1 0 0 0 1    # Outcome Date GA Lbr Len/2nd Weight Sex Delivery Anes PTL Lv  2 SAB      SAB     1 Term      Vag-Spont   LIV     The following portions of the patient's history were reviewed and updated as appropriate: allergies, current medications, past family history, past medical history, past social history, past surgical history and problem list.  Review of Systems Pertinent items are noted in HPI.    Objective:  BP 103/62   Pulse 80   Ht 5\' 2"  (1.575 m)   Wt 194 lb (88 kg)   BMI 35.48 kg/m  General Appearance:    Alert, cooperative, no distress, appears stated age  Head:    Normocephalic, without obvious abnormality, atraumatic  Eyes:    conjunctiva/corneas clear, EOM's intact, both eyes  Ears:    Normal external ear canals, both ears  Nose:   Nares normal, septum midline, mucosa normal, no drainage    or sinus tenderness  Throat:   Lips, mucosa, and tongue normal; teeth and gums normal  Neck:   Supple, symmetrical, trachea midline, no adenopathy;    thyroid:  no enlargement/tenderness/nodules  Back:     Symmetric, no curvature, ROM normal, no CVA tenderness  Lungs:     respirations unlabored  Chest Wall:    No tenderness or deformity   Heart:    Regular rate and rhythm  Breast Exam:    No tenderness, masses, or nipple abnormality  Abdomen:     Soft, non-tender, bowel sounds  active all four quadrants,    no masses, no organomegaly  Genitalia:    Normal female without lesion, discharge or tenderness     Extremities:   Extremities normal, atraumatic, no cyanosis or edema  Pulses:   2+ and symmetric all extremities  Skin:   Skin color, texture, turgor normal, no rashes or lesions    Assessment:    Healthy female exam.   Uterine fibroids  Perimenopausal bleeding    Plan:   Diagnoses and all orders for this visit:  Well female exam with routine gynecological exam -     Cytology - PAP( [place])  Abnormal uterine bleeding -     US Transvaginal Non-OB; Future  Pt will f/u based on [place] results. She understands that if she has a thickened endometrium, she will need an Endo bx.   Rachael Zapanta L. Harraway-Smith, M.D., Cherlynn June

## 2020-01-13 ENCOUNTER — Ambulatory Visit (HOSPITAL_BASED_OUTPATIENT_CLINIC_OR_DEPARTMENT_OTHER): Payer: BC Managed Care – PPO

## 2020-01-17 ENCOUNTER — Encounter: Payer: Self-pay | Admitting: Obstetrics & Gynecology

## 2020-01-17 LAB — CYTOLOGY - PAP
Comment: NEGATIVE
Diagnosis: NEGATIVE
Diagnosis: REACTIVE
High risk HPV: NEGATIVE

## 2020-01-24 ENCOUNTER — Encounter: Payer: Self-pay | Admitting: Family Medicine

## 2020-01-24 ENCOUNTER — Ambulatory Visit (INDEPENDENT_AMBULATORY_CARE_PROVIDER_SITE_OTHER): Payer: BC Managed Care – PPO | Admitting: Family Medicine

## 2020-01-24 ENCOUNTER — Other Ambulatory Visit: Payer: Self-pay

## 2020-01-24 VITALS — BP 128/82 | HR 83 | Temp 98.2°F | Ht 62.0 in | Wt 192.2 lb

## 2020-01-24 DIAGNOSIS — Z0000 Encounter for general adult medical examination without abnormal findings: Secondary | ICD-10-CM

## 2020-01-24 NOTE — Progress Notes (Signed)
Chief Complaint  Patient presents with  . Annual Exam     Well Woman Dawn Leach is here for a complete physical.   Her last physical was >1 year ago.  Current diet: in general, a "healthy" diet. Current exercise: walking on incline, wt resistance, stretching. Weight is intentionally decreasing and she denies fatigue out of ordinary. Seatbelt? Yes  Health Maintenance Pap/HPV- Yes Mammogram- Yes Colon cancer screening-Yes Shingrix- No Tetanus- Yes Hep C screening- Yes HIV screening- Yes  Past Medical History:  Diagnosis Date  . Allergy   . Anemia   . Edema, lower extremity      Past Surgical History:  Procedure Laterality Date  . NO PAST SURGERIES      Medications  Current Outpatient Medications on File Prior to Visit  Medication Sig Dispense Refill  . Cholecalciferol (VITAMIN D3) 5000 units CAPS Take 5,000 Units by mouth daily.    . Multiple Vitamin (MULTIVITAMIN) tablet Take 1 tablet by mouth daily.     Allergies Allergies  Allergen Reactions  . Coconut (Cocos Nucifera) Allergy Skin Test   . Shellfish Allergy     Review of Systems: Constitutional:  no unexpected weight changes Eye:  no recent significant change in vision Ear/Nose/Mouth/Throat:  Ears:  no recent change in hearing Nose/Mouth/Throat:  no complaints of nasal congestion, no sore throat Cardiovascular: no chest pain Respiratory:  no shortness of breath Gastrointestinal:  no abdominal pain, no change in bowel habits GU:  Female: negative for dysuria or pelvic pain Musculoskeletal/Extremities:  no pain of the joints Integumentary (Skin/Breast):  no abnormal skin lesions reported Neurologic:  no headaches Endocrine:  denies fatigue  Exam BP 128/82 (BP Location: Left Arm, Patient Position: Sitting, Cuff Size: Normal)   Pulse 83   Temp 98.2 F (36.8 C) (Oral)   Ht 5\' 2"  (1.575 m)   Wt 192 lb 4 oz (87.2 kg)   SpO2 100%   BMI 35.16 kg/m  General:  well developed, well nourished, in no  apparent distress Skin:  no significant moles, warts, or growths Head:  no masses, lesions, or tenderness Eyes:  pupils equal and round, sclera anicteric without injection Ears:  canals without lesions, TMs shiny without retraction, no obvious effusion, no erythema Nose:  nares patent, septum midline, mucosa normal, and no drainage or sinus tenderness Throat/Pharynx:  lips and gingiva without lesion; tongue and uvula midline; non-inflamed pharynx; no exudates or postnasal drainage Neck: neck supple without adenopathy, thyromegaly, or masses Lungs:  clear to auscultation, breath sounds equal bilaterally, no respiratory distress Cardio:  regular rate and rhythm, no LE edema Abdomen:  abdomen soft, nontender; bowel sounds normal; no masses or organomegaly Genital: Defer to GYN Musculoskeletal:  symmetrical muscle groups noted without atrophy or deformity Extremities:  no clubbing, cyanosis, or edema, no deformities, no skin discoloration Neuro:  gait normal; deep tendon reflexes normal and symmetric Psych: well oriented with normal range of affect and appropriate judgment/insight  Assessment and Plan  Well adult exam   Well 54 y.o. female. Counseled on diet and exercise. Other orders as above. Follow up in 1 yr. The patient voiced understanding and agreement to the plan.  [place], DO 01/24/20 4:05 PM

## 2020-01-24 NOTE — Patient Instructions (Signed)

## 2020-01-25 LAB — LIPID PANEL
Cholesterol: 200 mg/dL (ref 0–200)
HDL: 60.6 mg/dL (ref >39.00)
LDL Cholesterol: 119 mg/dL — ABNORMAL HIGH (ref 0–99)
NonHDL: 139.62
Total CHOL/HDL Ratio: 3
Triglycerides: 105 mg/dL (ref 0.0–149.0)
VLDL: 21 mg/dL (ref 0.0–40.0)

## 2020-01-25 LAB — BASIC METABOLIC PANEL
BUN: 14 mg/dL (ref 6–23)
CO2: 26 mEq/L (ref 19–32)
Calcium: 9.9 mg/dL (ref 8.4–10.5)
Chloride: 102 mEq/L (ref 96–112)
Creatinine, Ser: 1 mg/dL (ref 0.40–1.20)
GFR: 63.96 mL/min (ref >60.00)
Glucose, Bld: 89 mg/dL (ref 70–99)
Potassium: 4.3 mEq/L (ref 3.5–5.1)
Sodium: 137 mEq/L (ref 135–145)

## 2020-01-31 ENCOUNTER — Encounter: Payer: Self-pay | Admitting: Family Medicine

## 2020-01-31 ENCOUNTER — Ambulatory Visit (INDEPENDENT_AMBULATORY_CARE_PROVIDER_SITE_OTHER): Payer: BC Managed Care – PPO

## 2020-01-31 ENCOUNTER — Other Ambulatory Visit: Payer: Self-pay

## 2020-01-31 DIAGNOSIS — Z23 Encounter for immunization: Secondary | ICD-10-CM

## 2020-01-31 NOTE — Progress Notes (Signed)
Patient here for shingles injection 1 of 2 as discussed with Dr. Nani Ravens during her last office visit.

## 2020-02-01 ENCOUNTER — Ambulatory Visit: Payer: BC Managed Care – PPO | Admitting: Obstetrics & Gynecology

## 2020-02-08 ENCOUNTER — Telehealth: Payer: Self-pay | Admitting: Family Medicine

## 2020-02-09 ENCOUNTER — Ambulatory Visit (INDEPENDENT_AMBULATORY_CARE_PROVIDER_SITE_OTHER): Payer: BC Managed Care – PPO | Admitting: Internal Medicine

## 2020-02-09 ENCOUNTER — Other Ambulatory Visit: Payer: Self-pay

## 2020-02-09 VITALS — BP 123/78 | HR 71 | Temp 98.2°F | Ht 62.0 in | Wt 192.0 lb

## 2020-02-09 DIAGNOSIS — M25562 Pain in left knee: Secondary | ICD-10-CM

## 2020-02-09 NOTE — Patient Instructions (Signed)
We are referring you to a sports medicine doctor.  If you do not hear from them in few days please let me know

## 2020-02-09 NOTE — Progress Notes (Signed)
   Subjective:    Patient ID: Dawn Leach, female    DOB: 03-Nov-1965, 54 y.o.   MRN: [id number]  DOS:  02/09/2020 Type of visit - description: Acute On November 17 she was doing her routine exercises and worked really hard on a stationary bike. The next day developed  medial aspect left knee pain. No actual injury/fall. Knee was not swollen or red. It hurts with certain positions.   Review of Systems See above   Past Medical History:  Diagnosis Date  . Allergy   . Anemia   . Edema, lower extremity     Past Surgical History:  Procedure Laterality Date  . NO PAST SURGERIES      Allergies as of 02/09/2020      Reactions   Coconut (cocos Nucifera) Allergy Skin Test    Shellfish Allergy       Medication List       Accurate as of February 09, 2020 11:06 AM. If you have any questions, ask your nurse or doctor.        multivitamin tablet Take 1 tablet by mouth daily.   Vitamin D3 125 MCG (5000 UT) Caps Take 5,000 Units by mouth daily.          Objective:   Physical Exam BP 123/78 (BP Location: Right Arm, Patient Position: Sitting, Cuff Size: Large)   Pulse 71   Temp 98.2 F (36.8 C) (Oral)   Ht 5\' 2"  (1.575 m)   Wt 192 lb (87.1 kg)   SpO2 100%   BMI 35.12 kg/m  General:   Well developed, NAD, BMI noted. HEENT:  Normocephalic . Face symmetric, atraumatic MSK  Right knee: Normal Left knee: No effusion, no deformities. Anterior drawer test negative, ++ pain at the internal aspect of the knee with valgus stress lower extremities: no pretibial edema bilaterally  Skin: Not pale. Not jaundice Neurologic:  alert & oriented X3.  Speech normal, gait appropriate for age and unassisted Psych--  Cognition and judgment appear intact.  Cooperative with normal attention span and concentration.  Behavior appropriate. No anxious or depressed appearing.      Assessment      54 year old female, PMH Include obesity, presents with  Left knee pain: Started almost  3 weeks ago, not getting better or worse, on exam she has definitely pain on the internal aspect of the knee with valgus stress. Ligament injury? [facility] referral   This visit occurred during the SARS-CoV-2 public health emergency.  Safety protocols were in place, including screening questions prior to the visit, additional usage of staff PPE, and extensive cleaning of exam room while observing appropriate contact time as indicated for disinfecting solutions.

## 2020-02-14 ENCOUNTER — Other Ambulatory Visit: Payer: Self-pay

## 2020-02-14 ENCOUNTER — Ambulatory Visit (INDEPENDENT_AMBULATORY_CARE_PROVIDER_SITE_OTHER): Payer: BC Managed Care – PPO | Admitting: Sports Medicine

## 2020-02-14 ENCOUNTER — Ambulatory Visit
Admission: RE | Admit: 2020-02-14 | Discharge: 2020-02-14 | Disposition: A | Discharge status: Home / Self Care | Payer: BC Managed Care – PPO | Source: Ambulatory Visit | Attending: Sports Medicine | Admitting: Sports Medicine

## 2020-02-14 ENCOUNTER — Other Ambulatory Visit: Payer: Self-pay | Admitting: Sports Medicine

## 2020-02-14 ENCOUNTER — Encounter: Payer: Self-pay | Admitting: Sports Medicine

## 2020-02-14 VITALS — BP 119/92 | Ht 62.0 in | Wt 191.0 lb

## 2020-02-14 DIAGNOSIS — M25562 Pain in left knee: Secondary | ICD-10-CM

## 2020-02-14 NOTE — Addendum Note (Signed)
Addended by: Cyd Silence on: 02/14/2020 10:28 AM   Modules accepted: Orders

## 2020-02-14 NOTE — Progress Notes (Addendum)
   Subjective:    Patient ID: Dawn Leach, female    DOB: 07-22-1965, 54 y.o.   MRN: [id number]  HPI chief complaint: Left knee pain  Very pleasant 54 year old female comes in today complaining of 4 weeks of medial sided left knee pain that began after an aggressive workout on a stationary bike at the gym.  She denies any immediate pain while working out, but began to experience pain the following day.  She did notice some mild swelling.  Her pain has persisted despite trying Biofreeze, ice, and rest.  She has not been able to return to her workouts at the gym other than some limited strength training.  Her pain is worse with walking.  She does endorse feelings of instability as well as painful popping.  No prior problems with this knee in the past.  No prior knee surgeries.  Pain does not radiate.  Past medical history reviewed Medications reviewed Allergies reviewed    Review of Systems    As above Objective:   Physical Exam  Developed, well nourished.  No acute distress  Left knee: Full range of motion.  Trace effusion.  She is tender to palpation along the medial joint line with pain but no popping with McMurray's.  No tenderness along the lateral joint line.  Knee is stable to valgus and varus stressing.  Negative anterior drawer, negative posterior drawer.  Neurovascularly intact distally.  Walking with a slight limp.      Assessment & Plan:   Left knee pain likely secondary to DJD versus degenerative meniscal tear  We are going to start with getting x-rays of the left knee.  I will call her with those results when available.  If no significant arthritis is seen then consider MRI to rule out degenerative meniscal tear.  In the meantime, we will fit her with a body helix compression sleeve to wear when ambulating and she will start daily isometric quad exercises as long as they are not too painful.  I also think that she can return to her stationary bike on a limited basis as long  as she is not aggressive and uses pain as her guide.  Addendum: X-rays reviewed.  They are unremarkable.  Specifically, I do not see any evidence of significant arthritis.  We will proceed with an MRI to rule out a medial meniscal tear.

## 2020-02-17 ENCOUNTER — Ambulatory Visit (INDEPENDENT_AMBULATORY_CARE_PROVIDER_SITE_OTHER): Payer: BC Managed Care – PPO | Admitting: Family Medicine

## 2020-02-17 ENCOUNTER — Other Ambulatory Visit: Payer: Self-pay

## 2020-02-17 VITALS — BP 106/72 | Ht 62.0 in | Wt 191.0 lb

## 2020-02-17 DIAGNOSIS — M79662 Pain in left lower leg: Secondary | ICD-10-CM

## 2020-02-17 DIAGNOSIS — M25562 Pain in left knee: Secondary | ICD-10-CM

## 2020-02-17 NOTE — Progress Notes (Addendum)
   Office Visit Note   Patient: Dawn Leach           Date of Birth: 24-May-1965           MRN: [id number] Visit Date: 02/17/2020 Requested by: Shelda Pal, DO [address] [place],  [place] [address] PCP: Shelda Pal, DO  Subjective: CC: Left Calf Pain  HPI: 54yo F presenting to clinic with concerns of left calf pain since wearing a knee compression sleeve earlier this week. She says that on she was given a Helix knee sleeve here on the 14th, wore it for the day (and felt like it was helping her walk more comfortably), but in the evening when she took it off she had significant calf pain/cramping distally. She says she was worried she had a blood clot, prompting her to come back in. Her pain has nearly resolved as of today. She denies any swelling to the leg. Says she has a history of poorly tolerating compression, but had hoped she wouldn't have a similar response with this sleeve. She has switched to KT tape, which she feels is just as helpful, without the compression pain. No SOB.               ROS:   All other systems were reviewed and are negative.  Objective: Vital Signs: BP 106/72   Ht 5\' 2"  (1.575 m)   Wt 191 lb (86.6 kg)   BMI 34.93 kg/m   Physical Exam:  General:  Alert and oriented, in no acute distress. Pulm:  Breathing unlabored. Psy:  Normal mood, congruent affect. Skin:  Bilateral lower legs with no swelling, no erythema, bruising, or rashes. Overlying skin intact.  Calf Exam:  Left calf with NO swelling when compared to right. Skin is normopigmented. Hommen's negative. No palpable cords in popliteal fossa.  Normal gait, knee and ankle with full ROM, brisk capillary refill.     Imaging: No results found.  Assessment & Plan: 54yo F presenting to clinic with concerns of calf pain after wearing her knee compression sleeve. States pain has resolved as of this morning, no SOB. Examination benign today, which is very reassuring  against DVT. - Discussed discontinuing the knee sleeve if it did not offer improvement. - Try Voltaren Gel for pain control - Per last provider's note, MRI to be ordered to evaluate knee, however order not yet placed. Will place now. - Patient was happy with reassurance, and had no further questions or concerns today.      Procedures: No procedures performed       Addendum:  I was the preceptor for this visit and available for immediate consultation.  Karlton Lemon MD Kirt Boys

## 2020-03-12 ENCOUNTER — Telehealth: Payer: Self-pay | Admitting: Family Medicine

## 2020-03-12 DIAGNOSIS — Z0184 Encounter for antibody response examination: Secondary | ICD-10-CM

## 2020-03-12 NOTE — Telephone Encounter (Signed)
Probably not, but if she needs it for school or a job, we can check a titer and re-vaccinate if her immunity waned.

## 2020-03-12 NOTE — Telephone Encounter (Signed)
What about the Hep B series, she did ask about that as well.

## 2020-03-12 NOTE — Telephone Encounter (Signed)
Advise on request. Checked [facility] and no records for either vaccine.

## 2020-03-12 NOTE — Telephone Encounter (Signed)
We can start the series, 1 shot now and the second in 6 months will hold her over for 20 years and be helpful if she decides to travel. Ty.

## 2020-03-12 NOTE — Telephone Encounter (Signed)
Patient informed of PCP instructions. Scheduled [place] for 03/14/20 for first Hep A vaccine. Also scheduled lab appt/ put in order for Hep B titer. She will need for work.

## 2020-03-12 NOTE — Telephone Encounter (Signed)
Patient states she heard it was a Hep A outbreak. She states that she dont think she had it. She wants to know if she qualify for it. She also states she had a  Hep B shot in 1998 and she wants to know if she should have another as well.. Please advise

## 2020-03-14 ENCOUNTER — Other Ambulatory Visit (INDEPENDENT_AMBULATORY_CARE_PROVIDER_SITE_OTHER): Payer: 59

## 2020-03-14 ENCOUNTER — Ambulatory Visit: Payer: BC Managed Care – PPO

## 2020-03-14 ENCOUNTER — Other Ambulatory Visit: Payer: Self-pay

## 2020-03-14 DIAGNOSIS — Z0184 Encounter for antibody response examination: Secondary | ICD-10-CM | POA: Diagnosis not present

## 2020-03-15 LAB — HEPATITIS B SURFACE ANTIBODY,QUALITATIVE: Hep B S Ab: NONREACTIVE

## 2020-03-16 ENCOUNTER — Ambulatory Visit: Payer: 59 | Admitting: Family Medicine

## 2020-03-16 ENCOUNTER — Ambulatory Visit: Payer: 59

## 2020-03-16 ENCOUNTER — Other Ambulatory Visit: Payer: Self-pay

## 2020-03-16 ENCOUNTER — Encounter: Payer: Self-pay | Admitting: Family Medicine

## 2020-03-16 ENCOUNTER — Other Ambulatory Visit (HOSPITAL_COMMUNITY)
Admission: RE | Admit: 2020-03-16 | Discharge: 2020-03-16 | Disposition: A | Discharge status: Home / Self Care | Payer: 59 | Source: Ambulatory Visit | Attending: Family Medicine | Admitting: Family Medicine

## 2020-03-16 VITALS — BP 120/78 | HR 58 | Temp 98.2°F | Ht 62.0 in | Wt 189.1 lb

## 2020-03-16 DIAGNOSIS — B373 Candidiasis of vulva and vagina: Secondary | ICD-10-CM | POA: Insufficient documentation

## 2020-03-16 DIAGNOSIS — B3731 Acute candidiasis of vulva and vagina: Secondary | ICD-10-CM

## 2020-03-16 DIAGNOSIS — Z23 Encounter for immunization: Secondary | ICD-10-CM | POA: Diagnosis not present

## 2020-03-16 MED ORDER — FLUCONAZOLE 150 MG PO TABS: ORAL_TABLET | ORAL | 0 refills | Status: DC

## 2020-03-16 NOTE — Patient Instructions (Addendum)
Find out how much the ultrasound would be without running it through insurance.  We will be in touch regarding your urine sample.  Let us know if you need anything.

## 2020-03-16 NOTE — Progress Notes (Addendum)
Chief Complaint  Patient presents with  . Follow-up  . Vaginitis    Dawn Leach is a 55 y.o. female here for possible yeast infection.   Duration: 3 days Description of discharge: no d/c Odor: No New sexual partner: No Urinary complaints: No IUD? Yes Denies fevers, pregnancy, abdominal pain. She continues to have vag bleeding and is working with OB/GYN.   Past Medical History:  Diagnosis Date  . Allergy   . Anemia   . Edema, lower extremity    Family History  Problem Relation Age of Onset  . Hypertension Father   . Diabetes Father   . High Cholesterol Father   . Heart disease Father   . Alcoholism Father   . Colon cancer Mother   . Cancer Mother   . Hypertension Mother   . Stroke Mother   . High Cholesterol Mother   . Heart disease Mother   . Depression Mother   . Obesity Mother   . Colon polyps Neg Hx   . Rectal cancer Neg Hx   . Stomach cancer Neg Hx     BP 120/78 (BP Location: Left Arm, Patient Position: Sitting, Cuff Size: Normal)   Pulse (!) 58   Temp 98.2 F (36.8 C) (Oral)   Ht 5\' 2"  (1.575 m)   Wt 189 lb 2 oz (85.8 kg)   SpO2 98%   BMI 34.59 kg/m  Gen: Awake, alert, appears stated age Heart: RRR Lungs: CTAB. no accessory muscle use Abd: BS+, soft, NT, ND, no masses or organomegaly GU: declined Psych: Age appropriate judgment and insight, nml mood and affect  Yeast vaginitis - Plan: fluconazole (DIFLUCAN) 150 MG tablet, Urine cytology ancillary only([place])  Orders as above. Ck for BV, trich, yeast on urine.  3 shot series for Twinrix. First shot today, next is in 1 month. Final shot in 6 mo from now in mid July.  F/u prn. Pt voiced understanding and agreement to the plan.  [place], DO 03/16/20 10:36 AM

## 2020-03-19 LAB — URINE CYTOLOGY ANCILLARY ONLY
Bacterial Vaginitis-Urine: NEGATIVE
Candida Urine: NEGATIVE
Comment: NEGATIVE
Trichomonas: NEGATIVE

## 2020-04-18 ENCOUNTER — Ambulatory Visit (INDEPENDENT_AMBULATORY_CARE_PROVIDER_SITE_OTHER): Payer: 59

## 2020-04-18 ENCOUNTER — Other Ambulatory Visit: Payer: Self-pay | Admitting: Family Medicine

## 2020-04-18 ENCOUNTER — Other Ambulatory Visit: Payer: Self-pay

## 2020-04-18 DIAGNOSIS — Z23 Encounter for immunization: Secondary | ICD-10-CM | POA: Diagnosis not present

## 2020-04-18 NOTE — Progress Notes (Unsigned)
Pt is here today for 2nd shingrix and 2nd twinrix. Pt was given twinrix in left deltoid and shingrix in right deltoid. Pt tolerated well

## 2020-05-16 ENCOUNTER — Other Ambulatory Visit: Payer: Self-pay

## 2020-05-16 ENCOUNTER — Encounter: Payer: Self-pay | Admitting: Family Medicine

## 2020-05-16 ENCOUNTER — Ambulatory Visit: Payer: 59 | Admitting: Family Medicine

## 2020-05-16 ENCOUNTER — Ambulatory Visit: Payer: 59 | Admitting: Internal Medicine

## 2020-05-16 VITALS — BP 122/64 | HR 78 | Resp 18 | Ht 62.0 in | Wt 184.0 lb

## 2020-05-16 DIAGNOSIS — Z111 Encounter for screening for respiratory tuberculosis: Secondary | ICD-10-CM | POA: Diagnosis not present

## 2020-05-16 DIAGNOSIS — Z0289 Encounter for other administrative examinations: Secondary | ICD-10-CM | POA: Diagnosis not present

## 2020-05-16 NOTE — Progress Notes (Signed)
Chief Complaint  Patient presents with  . Health Screening Form    Subjective: Patient is a 55 y.o. female here for completion of a health screening form.  CPE in 01/2020. She is moving and will thus transfer care. Needs a form completed for work.  She has no CP or SOB. No issues lifting things. No concerns w hearing or vision.   Past Medical History:  Diagnosis Date  . Allergy   . Anemia   . Edema, lower extremity     Objective: BP 122/64 (BP Location: Right Arm, Patient Position: Sitting, Cuff Size: Large)   Pulse 78   Resp 18   Ht 5\' 2"  (1.575 m)   Wt 184 lb (83.5 kg)   SpO2 99%   BMI 33.65 kg/m  General: Awake, appears stated age HEENT: PERRLA, EOMi Neuro: 5/5 strength throughout Heart: RRR, no murmurs Lungs: CTAB, no rales, wheezes or rhonchi. No accessory muscle use Psych: Age appropriate judgment and insight, normal affect and mood  Assessment and Plan: Encounter for completion of form with patient  Visit for TB skin test - Plan: PPD  Form completed and signed.  Will have TB skin test read on Friday in 2 days.  Form will be marked negative and she will take the form with her.  She is moving location and will not follow-up with [place] further. The patient voiced understanding and agreement to the plan.  [place], DO 05/16/20  8:17 AM

## 2020-05-16 NOTE — Patient Instructions (Signed)
The form will be completed and waiting up front for a negative result on Friday.  Good luck in the future.   Let us know if you need anything.

## 2020-05-21 ENCOUNTER — Telehealth: Payer: Self-pay | Admitting: Family Medicine

## 2020-05-21 NOTE — Telephone Encounter (Signed)
She can either repeat the TST and come in Wed or get the blood test. Ty.

## 2020-05-21 NOTE — Telephone Encounter (Signed)
Caller: Everlyn  Call Back @ [phone]  Subject : TB   Patient states she had a tb skin test on Wednesday morning , patient was suppose to come back on Friday morning. Due to patient's schedule she could not. Please contact patient to let her know the next steps.

## 2020-05-21 NOTE — Telephone Encounter (Signed)
She called in and scheduled appt.

## 2020-05-23 ENCOUNTER — Ambulatory Visit (INDEPENDENT_AMBULATORY_CARE_PROVIDER_SITE_OTHER): Payer: 59

## 2020-05-23 ENCOUNTER — Other Ambulatory Visit: Payer: Self-pay

## 2020-05-23 DIAGNOSIS — Z111 Encounter for screening for respiratory tuberculosis: Secondary | ICD-10-CM

## 2020-05-23 NOTE — Progress Notes (Signed)
Pt is here today for TB skin test placement placed in left forearm. Pt will return on Friday to be read.

## 2020-05-25 LAB — TB SKIN TEST
Induration: 0 mm
TB Skin Test: NEGATIVE

## 2020-09-11 ENCOUNTER — Telehealth: Payer: Self-pay | Admitting: *Deleted

## 2020-09-11 NOTE — Telephone Encounter (Signed)
Called the patient to confirm to cancel this appt. 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  She will then reschedule this.

## 2020-09-11 NOTE — Telephone Encounter (Signed)
Caller Name Savanna Phone Number [phone] Patient Name Dawn Leach Patient DOB 06-15-1965 Call Type Message Only Information Provided Reason for Call Request to [facility] Appointment Initial Comment Caller states she was supposed to come in tomorrow for her last injection for hepatitis. She won't be able to make it. Patient request to speak to RN No Disp. Time Disposition Final User 09/10/2020 5:05:57 PM General Information Provided Yes JOHNS, MATHEW

## 2020-09-12 ENCOUNTER — Ambulatory Visit: Payer: 59

## 2020-10-12 IMAGING — MG DIGITAL SCREENING BILAT W/ TOMO W/ CAD
6 of 10 series · 6 of 30 positions shown · non-contrast
Comparison: Previous exam(s).

CLINICAL DATA: Screening.

EXAM:
DIGITAL SCREENING BILATERAL MAMMOGRAM WITH TOMO AND CAD

[L MLO synth-2D]
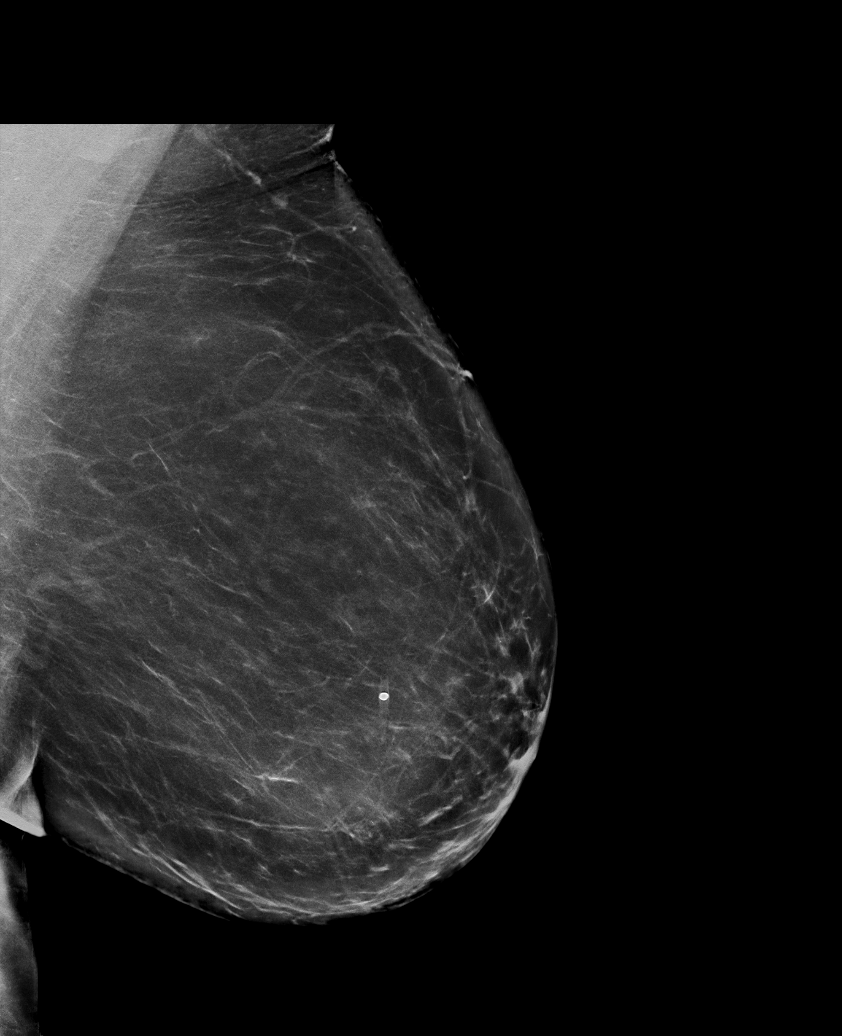

[R CC synth-2D]
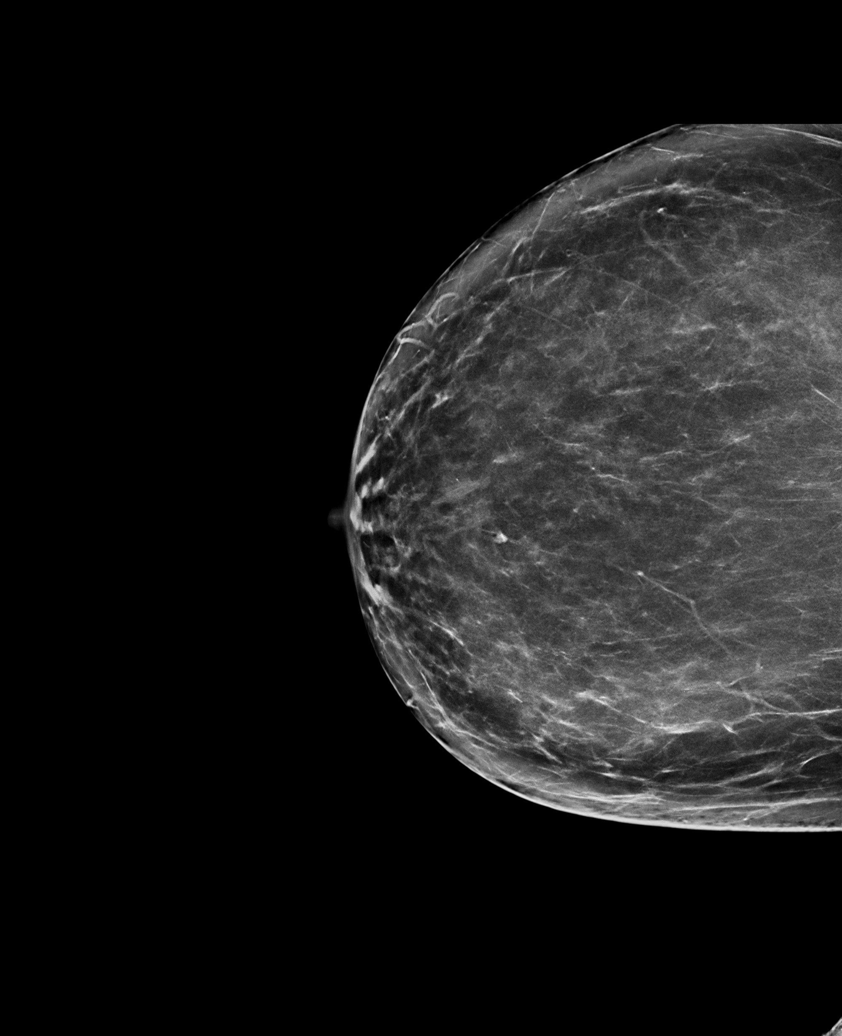

[L CC synth-2D]
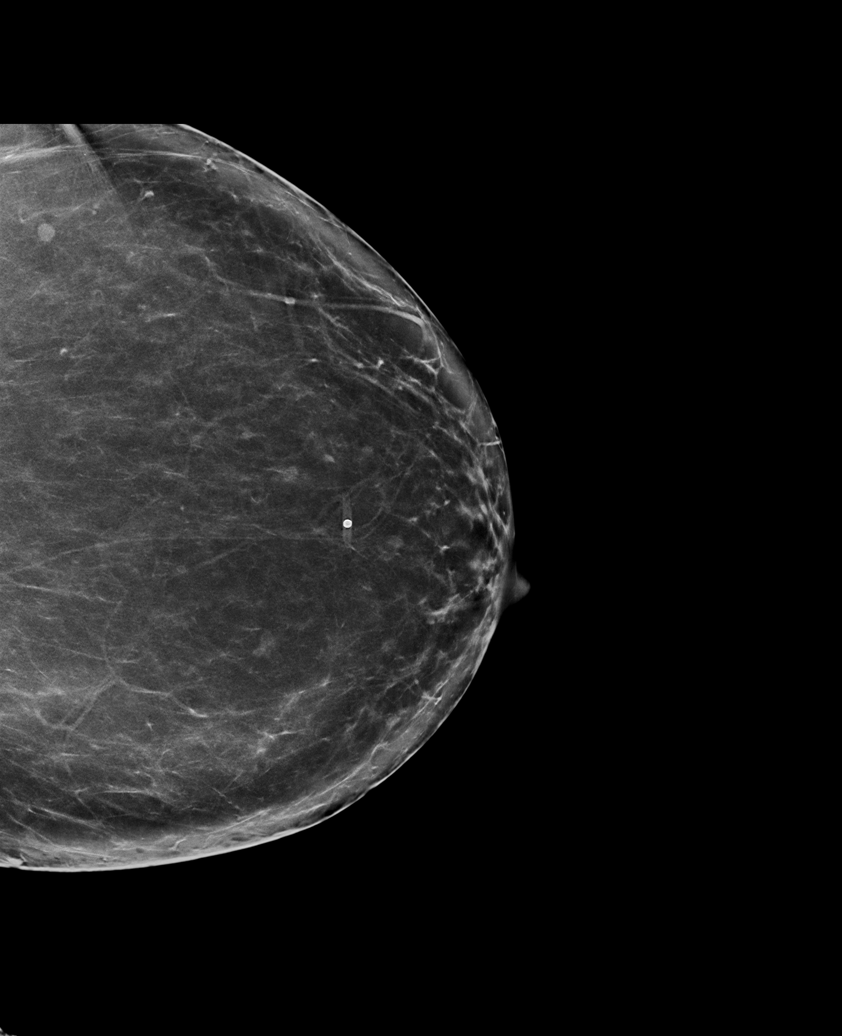

[R XCCL synth-2D]
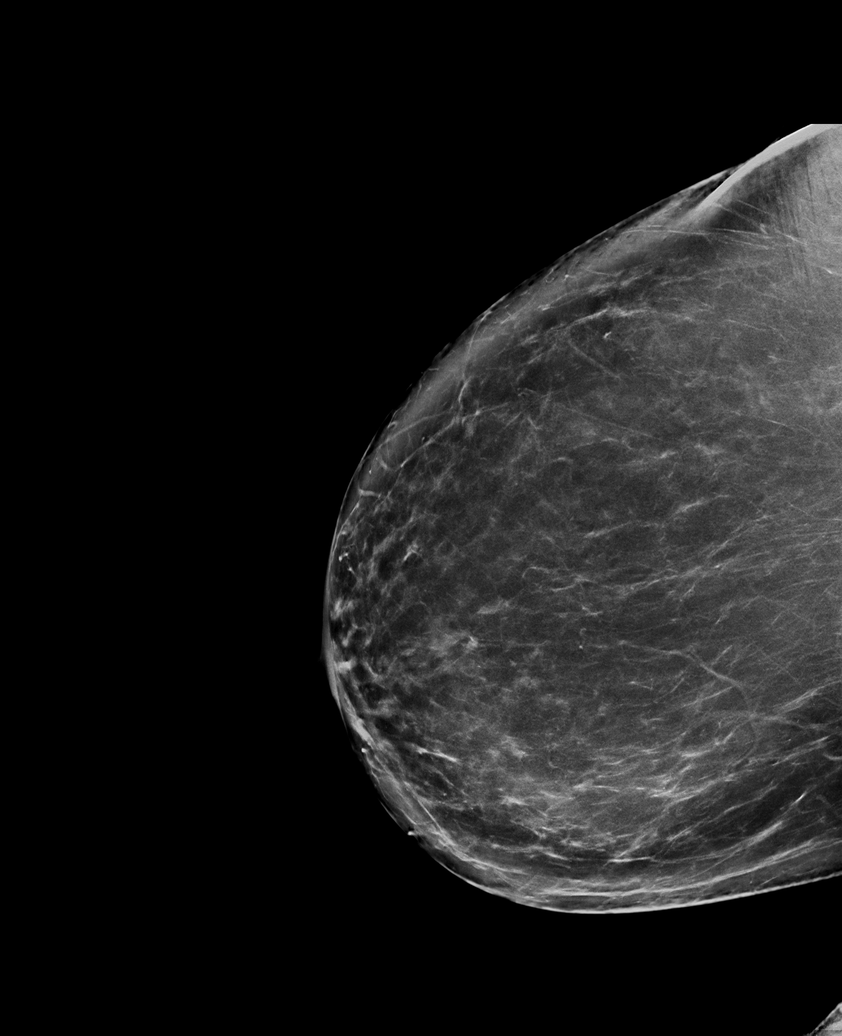

[R MLO synth-2D]
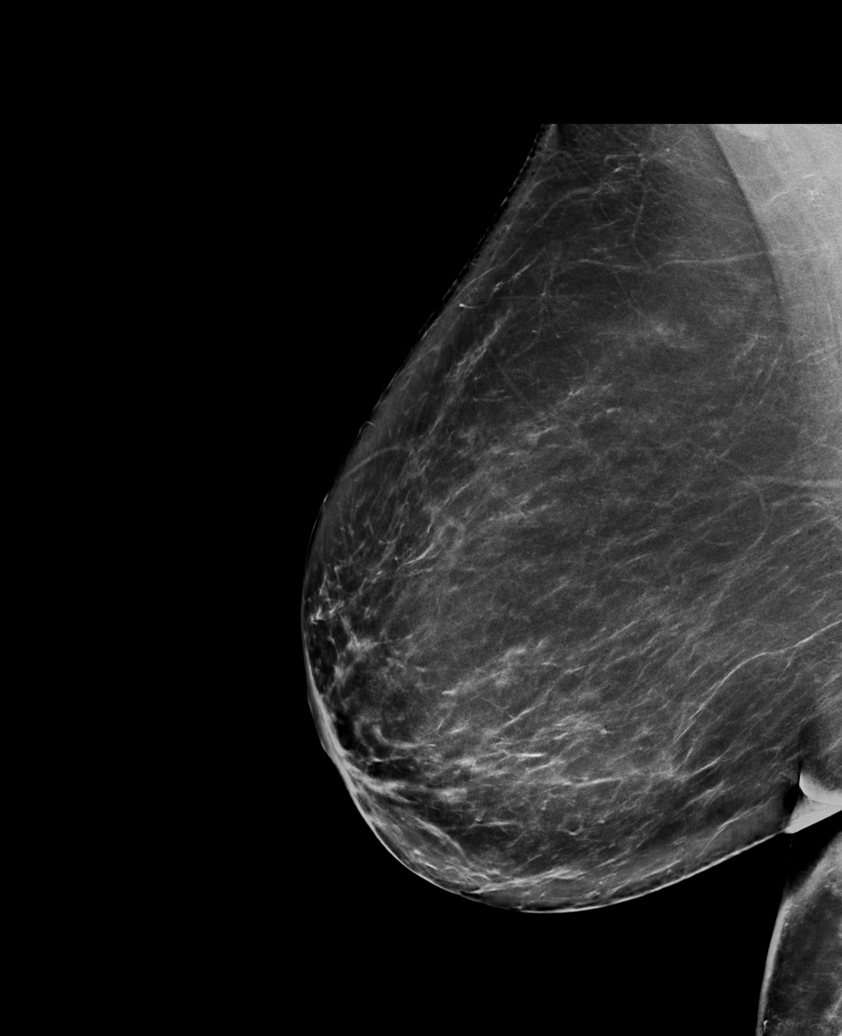

[L MLO tomo · tomo slice 49/97.0]
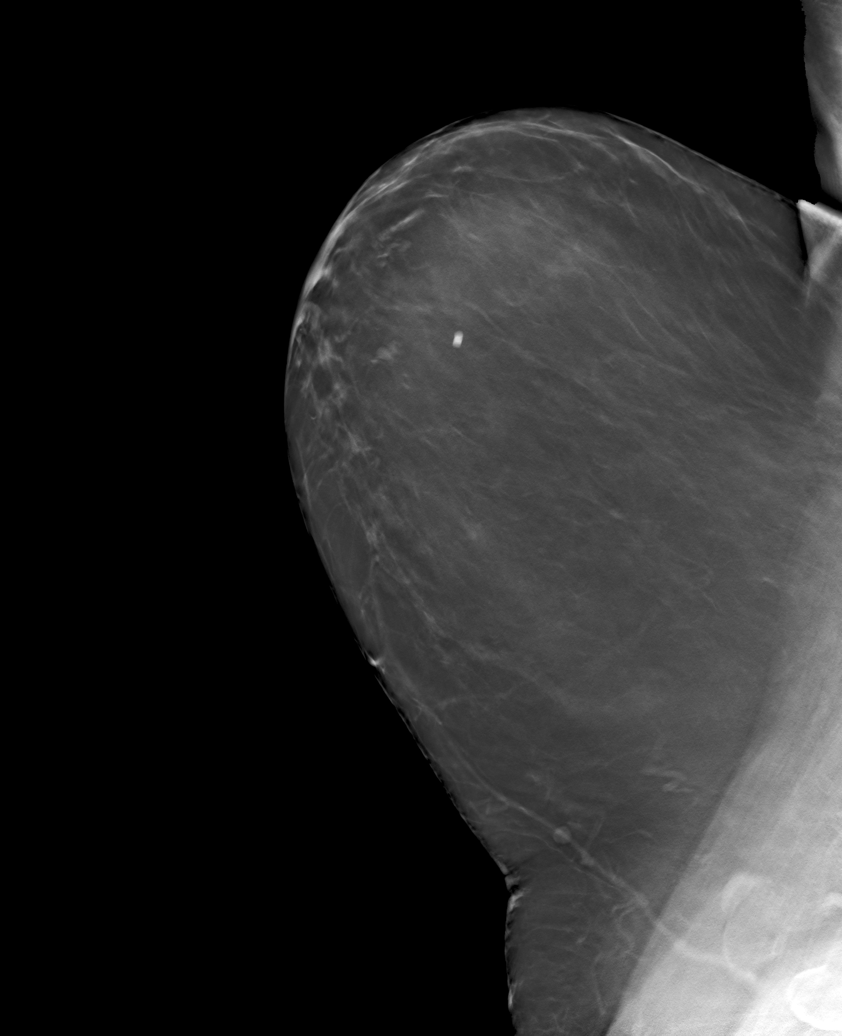

[6 of 30 positions shown; findings below may reference images not displayed]

ACR Breast Density Category b: There are scattered areas of
fibroglandular density.
FINDINGS: There are no findings suspicious for malignancy. Images were
processed with CAD.
IMPRESSION: No mammographic evidence of malignancy. A result letter of this
screening mammogram will be mailed directly to the patient.

RECOMMENDATION:
Screening mammogram in one year. (Code:CN-U-775)

BI-RADS CATEGORY  1: Negative.

## 2020-12-26 ENCOUNTER — Other Ambulatory Visit (HOSPITAL_BASED_OUTPATIENT_CLINIC_OR_DEPARTMENT_OTHER): Payer: Self-pay | Admitting: Family Medicine

## 2020-12-26 DIAGNOSIS — Z1231 Encounter for screening mammogram for malignant neoplasm of breast: Secondary | ICD-10-CM

## 2021-02-18 ENCOUNTER — Ambulatory Visit: Payer: Self-pay | Admitting: Obstetrics & Gynecology

## 2021-02-18 ENCOUNTER — Inpatient Hospital Stay (HOSPITAL_BASED_OUTPATIENT_CLINIC_OR_DEPARTMENT_OTHER): Admission: RE | Admit: 2021-02-18 | Payer: Self-pay | Source: Ambulatory Visit

## 2021-02-27 ENCOUNTER — Ambulatory Visit: Payer: Self-pay | Admitting: Obstetrics & Gynecology

## 2021-03-13 ENCOUNTER — Ambulatory Visit: Payer: Self-pay | Admitting: Obstetrics & Gynecology

## 2021-03-13 ENCOUNTER — Ambulatory Visit (HOSPITAL_BASED_OUTPATIENT_CLINIC_OR_DEPARTMENT_OTHER): Payer: Self-pay

## 2021-03-18 ENCOUNTER — Ambulatory Visit (HOSPITAL_BASED_OUTPATIENT_CLINIC_OR_DEPARTMENT_OTHER)
Admission: RE | Admit: 2021-03-18 | Discharge: 2021-03-18 | Disposition: A | Discharge status: Home / Self Care | Payer: Managed Care, Other (non HMO) | Source: Ambulatory Visit | Attending: Family Medicine | Admitting: Family Medicine

## 2021-03-18 ENCOUNTER — Other Ambulatory Visit: Payer: Self-pay

## 2021-03-18 ENCOUNTER — Encounter (HOSPITAL_BASED_OUTPATIENT_CLINIC_OR_DEPARTMENT_OTHER): Payer: Self-pay

## 2021-03-18 DIAGNOSIS — Z1231 Encounter for screening mammogram for malignant neoplasm of breast: Secondary | ICD-10-CM | POA: Diagnosis present

## 2021-03-25 ENCOUNTER — Telehealth: Payer: Self-pay

## 2021-03-25 NOTE — Telephone Encounter (Signed)
Patient is asking for a TOC from Dr. Nani Ravens to Dr. Larose Kells.  Please advise.

## 2021-03-25 NOTE — Telephone Encounter (Signed)
Please advise 

## 2021-03-26 ENCOUNTER — Ambulatory Visit: Payer: Managed Care, Other (non HMO) | Admitting: Internal Medicine

## 2021-03-26 NOTE — Telephone Encounter (Signed)
Patient informed Scheduled CPE with Dr. Nani Ravens

## 2021-03-26 NOTE — Telephone Encounter (Signed)
Please inform Pt of Dr. Paz's decision. Thank you.  

## 2021-03-26 NOTE — Telephone Encounter (Signed)
Decline, thank you

## 2021-03-27 ENCOUNTER — Ambulatory Visit (INDEPENDENT_AMBULATORY_CARE_PROVIDER_SITE_OTHER): Payer: Managed Care, Other (non HMO) | Admitting: Obstetrics & Gynecology

## 2021-03-27 ENCOUNTER — Other Ambulatory Visit (HOSPITAL_COMMUNITY)
Admission: RE | Admit: 2021-03-27 | Discharge: 2021-03-27 | Disposition: A | Discharge status: Home / Self Care | Payer: Managed Care, Other (non HMO) | Source: Ambulatory Visit | Attending: Obstetrics & Gynecology | Admitting: Obstetrics & Gynecology

## 2021-03-27 ENCOUNTER — Encounter: Payer: Self-pay | Admitting: Obstetrics & Gynecology

## 2021-03-27 VITALS — BP 116/65 | HR 67 | Ht 62.0 in | Wt 185.0 lb

## 2021-03-27 DIAGNOSIS — N898 Other specified noninflammatory disorders of vagina: Secondary | ICD-10-CM | POA: Diagnosis not present

## 2021-03-27 DIAGNOSIS — R3121 Asymptomatic microscopic hematuria: Secondary | ICD-10-CM | POA: Diagnosis not present

## 2021-03-27 DIAGNOSIS — R399 Unspecified symptoms and signs involving the genitourinary system: Secondary | ICD-10-CM

## 2021-03-27 DIAGNOSIS — Z01419 Encounter for gynecological examination (general) (routine) without abnormal findings: Secondary | ICD-10-CM

## 2021-03-27 LAB — POCT URINALYSIS DIPSTICK
Bilirubin, UA: NEGATIVE
Glucose, UA: NEGATIVE
Ketones, UA: NEGATIVE
Leukocytes, UA: NEGATIVE
Nitrite, UA: NEGATIVE
Protein, UA: NEGATIVE
Spec Grav, UA: 1.01 (ref 1.010–1.025)
Urobilinogen, UA: 0.2 E.U./dL
pH, UA: 5 (ref 5.0–8.0)

## 2021-03-27 NOTE — Progress Notes (Signed)
Subjective:     Dawn Leach is a 56 y.o. female here for a routine exam.  Current complaints: pt reports some vaginal discharge. She is not sure if this is sweating or not. No assoc sx. Pt reports some lower abd pressure no dysuria.     Gynecologic History No LMP recorded. (Menstrual status: IUD). Contraception: post menopausal status Last Pap: 01/17/2020. Results were: normal Last mammogram: 03/18/2021. Results were: normal  Obstetric History OB History  Gravida Para Term Preterm AB Living  2 1 1  0 1 1  SAB IAB Ectopic Multiple Live Births  1 0 0 0 1    # Outcome Date GA Lbr Len/2nd Weight Sex Delivery Anes PTL Lv  2 SAB      SAB     1 Term      Vag-Spont   LIV     The following portions of the patient's history were reviewed and updated as appropriate: allergies, current medications, past family history, past medical history, past social history, past surgical history, and problem list.  Review of Systems Pertinent items are noted in HPI.    Objective:  BP 116/65    Pulse 67    Ht 5\' 2"  (1.575 m)    Wt 185 lb (83.9 kg)    BMI 33.84 kg/m  General Appearance:    Alert, cooperative, no distress, appears stated age  Head:    Normocephalic, without obvious abnormality, atraumatic  Eyes:    conjunctiva/corneas clear, EOM's intact, both eyes  Ears:    Normal external ear canals, both ears  Nose:   Nares normal, septum midline, mucosa normal, no drainage    or sinus tenderness  Throat:   Lips, mucosa, and tongue normal; teeth and gums normal  Neck:   Supple, symmetrical, trachea midline, no adenopathy;    thyroid:  no enlargement/tenderness/nodules  Back:     Symmetric, no curvature, ROM normal, no CVA tenderness  Lungs:     respirations unlabored  Chest Wall:    No tenderness or deformity   Heart:    Regular rate and rhythm  Breast Exam:    No tenderness, masses, or nipple abnormality  Abdomen:     Soft, non-tender, bowel sounds active all four quadrants,    no masses, no  organomegaly  Genitalia:    Normal female without lesion, discharge or tenderness     Extremities:   Extremities normal, atraumatic, no cyanosis or edema  Pulses:   2+ and symmetric all extremities  Skin:   Skin color, texture, turgor normal, no rashes or lesions     Assessment:    Healthy female exam.   Vulvar sweating Plan:   Dawn Leach was seen today for annual exam.  Diagnoses and all orders for this visit:  UTI symptoms -     POCT Urinalysis Dipstick -     Urine Culture  Well woman exam -     Cytology - PAP( [place]) -     Comprehensive metabolic panel -     CBC -     Hemoglobin A1c  Vaginal discharge -     Cervicovaginal ancillary only( [place])  Well female exam with routine gynecological exam -     Comprehensive metabolic panel -     CBC -     Hemoglobin A1c  Asymptomatic microscopic hematuria -     Comprehensive metabolic panel -     CBC -     Hemoglobin A1c   F/u in 1  year or sooner prn   Shron Ozer L. Harraway-Smith, M.D., Cherlynn June

## 2021-03-27 NOTE — Progress Notes (Signed)
Last pap- 01/11/20- negative Last mammogram- 03/18/21 Pt c/o vaginal and abdominal pain Pt wants to be checked for UTI

## 2021-03-28 ENCOUNTER — Ambulatory Visit: Payer: Managed Care, Other (non HMO) | Admitting: Internal Medicine

## 2021-03-28 LAB — COMPREHENSIVE METABOLIC PANEL WITH GFR
ALT: 21 [IU]/L (ref 0–32)
AST: 20 [IU]/L (ref 0–40)
Albumin/Globulin Ratio: 1.5 (ref 1.2–2.2)
Albumin: 4.5 g/dL (ref 3.8–4.9)
Alkaline Phosphatase: 114 [IU]/L (ref 44–121)
BUN/Creatinine Ratio: 23 (ref 9–23)
BUN: 20 mg/dL (ref 6–24)
Bilirubin Total: 0.4 mg/dL (ref 0.0–1.2)
CO2: 24 mmol/L (ref 20–29)
Calcium: 10.3 mg/dL — ABNORMAL HIGH (ref 8.7–10.2)
Chloride: 102 mmol/L (ref 96–106)
Creatinine, Ser: 0.87 mg/dL (ref 0.57–1.00)
Globulin, Total: 3 g/dL (ref 1.5–4.5)
Glucose: 87 mg/dL (ref 70–99)
Potassium: 4.5 mmol/L (ref 3.5–5.2)
Sodium: 139 mmol/L (ref 134–144)
Total Protein: 7.5 g/dL (ref 6.0–8.5)
eGFR: 79 mL/min/{1.73_m2}

## 2021-03-28 LAB — CBC
Hematocrit: 37.4 % (ref 34.0–46.6)
Hemoglobin: 12.3 g/dL (ref 11.1–15.9)
MCH: 26.8 pg (ref 26.6–33.0)
MCHC: 32.9 g/dL (ref 31.5–35.7)
MCV: 82 fL (ref 79–97)
Platelets: 351 10*3/uL (ref 150–450)
RBC: 4.59 x10E6/uL (ref 3.77–5.28)
RDW: 14.6 % (ref 11.7–15.4)
WBC: 5 10*3/uL (ref 3.4–10.8)

## 2021-03-28 LAB — HEMOGLOBIN A1C
Est. average glucose Bld gHb Est-mCnc: 126 mg/dL
Hgb A1c MFr Bld: 6 % — ABNORMAL HIGH (ref 4.8–5.6)

## 2021-03-29 ENCOUNTER — Encounter: Payer: Self-pay | Admitting: Obstetrics & Gynecology

## 2021-03-29 LAB — CERVICOVAGINAL ANCILLARY ONLY
Bacterial Vaginitis (gardnerella): NEGATIVE
Candida Glabrata: NEGATIVE
Candida Vaginitis: NEGATIVE
Comment: NEGATIVE
Comment: NEGATIVE
Comment: NEGATIVE

## 2021-03-29 LAB — URINE CULTURE

## 2021-04-02 LAB — CYTOLOGY - PAP
Comment: NEGATIVE
Diagnosis: NEGATIVE
Diagnosis: REACTIVE
High risk HPV: NEGATIVE

## 2021-04-26 ENCOUNTER — Encounter: Payer: Managed Care, Other (non HMO) | Admitting: Family Medicine

## 2021-04-29 ENCOUNTER — Ambulatory Visit: Payer: Managed Care, Other (non HMO) | Admitting: Family Medicine

## 2021-05-03 ENCOUNTER — Encounter: Payer: Managed Care, Other (non HMO) | Admitting: Family Medicine

## 2021-05-06 ENCOUNTER — Encounter: Payer: Self-pay | Admitting: Family Medicine

## 2021-05-06 ENCOUNTER — Ambulatory Visit (INDEPENDENT_AMBULATORY_CARE_PROVIDER_SITE_OTHER): Payer: Managed Care, Other (non HMO) | Admitting: Family Medicine

## 2021-05-06 VITALS — BP 112/68 | HR 75 | Temp 97.9°F | Ht 62.0 in | Wt 187.0 lb

## 2021-05-06 DIAGNOSIS — R319 Hematuria, unspecified: Secondary | ICD-10-CM

## 2021-05-06 DIAGNOSIS — Z0000 Encounter for general adult medical examination without abnormal findings: Secondary | ICD-10-CM | POA: Diagnosis not present

## 2021-05-06 NOTE — Progress Notes (Signed)
Chief Complaint  ?Patient presents with  ? Annual Exam  ?  Back pain ?  ?  ? ?Well Woman ?Dawn Leach is here for a complete physical.   ?Her last physical was >1 year ago.  ?Current diet: in general, a "healthy" diet. ?Current exercise: walking, wt resistance. ?Weight is stable and she denies fatigue out of ordinary. ?Seatbelt? Yes ?Advanced directive? Unsure ? ?Health Maintenance ?Pap/HPV- Yes ?Mammogram- Yes ?Colon cancer screening-Yes ?Shingrix- Yes ?Tetanus- Yes ?Hep C screening- Yes ?HIV screening- Yes ? ?Past Medical History:  ?Diagnosis Date  ? Allergy   ? Anemia   ? Edema, lower extremity   ?  ? ?Past Surgical History:  ?Procedure Laterality Date  ? NO PAST SURGERIES    ? ? ?Medications  ?Current Outpatient Medications on File Prior to Visit  ?Medication Sig Dispense Refill  ? Cholecalciferol (VITAMIN D3) 5000 units CAPS Take 5,000 Units by mouth daily. (Patient not taking: Reported on 03/27/2021)    ? levonorgestrel (MIRENA) 20 MCG/DAY IUD 1 each by Intrauterine route once.    ? ? ?Allergies ?Allergies  ?Allergen Reactions  ? Coconut (Cocos Nucifera) Allergy Skin Test   ? Penicillins Other (See Comments)  ?  seizures  ? Shellfish Allergy   ? ? ?Review of Systems: ?Constitutional:  no unexpected weight changes ?Eye:  no recent significant change in vision ?Ear/Nose/Mouth/Throat:  Ears:  no recent change in hearing ?Nose/Mouth/Throat:  no complaints of nasal congestion, no sore throat ?Cardiovascular: no chest pain ?Respiratory:  no shortness of breath ?Gastrointestinal:  no abdominal pain, no change in bowel habits ?GU:  Female: negative for dysuria or pelvic pain ?Musculoskeletal/Extremities:  +low back pain ?Integumentary (Skin/Breast):  no abnormal skin lesions reported ?Neurologic:  no headaches ?Endocrine:  denies fatigue ? ?Exam ?BP 112/68   Pulse 75   Temp 97.9 ?F (36.6 ?C) (Oral)   Ht '5\' 2"'$  (1.575 m)   Wt 187 lb (84.8 kg)   SpO2 94%   BMI 34.20 kg/m?  ?General:  well developed, well  nourished, in no apparent distress ?Skin:  no significant moles, warts, or growths ?Head:  no masses, lesions, or tenderness ?Eyes:  pupils equal and round, sclera anicteric without injection ?Ears:  canals without lesions, TMs shiny without retraction, no obvious effusion, no erythema ?Nose:  nares patent, septum midline, mucosa normal, and no drainage or sinus tenderness ?Throat/Pharynx:  lips and gingiva without lesion; tongue and uvula midline; non-inflamed pharynx; no exudates or postnasal drainage ?Neck: neck supple without adenopathy, thyromegaly, or masses ?Lungs:  clear to auscultation, breath sounds equal bilaterally, no respiratory distress ?Cardio:  regular rate and rhythm, no LE edema ?Abdomen:  abdomen soft, nontender; bowel sounds normal; no masses or organomegaly ?Genital: Defer to GYN ?Musculoskeletal: Minor TTP over the lumbar paraspinal musculature, symmetrical muscle groups noted without atrophy or deformity ?Extremities:  no clubbing, cyanosis, or edema, no deformities, no skin discoloration ?Neuro:  gait normal; deep tendon reflexes normal and symmetric ?Psych: well oriented with normal range of affect and appropriate judgment/insight ? ?Assessment and Plan ? ?Well adult exam - Plan: Lipid panel ? ?Hypercalcemia - Plan: PTH, Intact and Calcium, Hepatic function panel ? ?Hematuria, unspecified type - Plan: Urine Microscopic Only  ? ?Well 56 y.o. female. ?Counseled on diet and exercise. ?Other orders as above. ?Advanced directive form provided and completed copy requested. ?We will follow-up on hypercalcemia and hematuria seen on UA with her gynecologist.  We will also check her cholesterol.  She will return for  fasting labs to do this. ?Follow up in 1 yr or prn. ?The patient voiced understanding and agreement to the plan. ? ?Shelda Pal, DO ?05/06/21 ?3:37 PM ? ?

## 2021-05-06 NOTE — Patient Instructions (Addendum)
Give Dawn Leach 2-3 business days to get the results of your labs back.   Keep the diet clean and stay active.  Please get me a copy of your advanced directive form at your convenience.   Let Dawn Leach know if you need anything.  EXERCISES  RANGE OF MOTION (ROM) AND STRETCHING EXERCISES - Low Back Pain Most people with lower back pain will find that their symptoms get worse with excessive bending forward (flexion) or arching at the lower back (extension). The exercises that will help resolve your symptoms will focus on the opposite motion.  If you have pain, numbness or tingling which travels down into your buttocks, leg or foot, the goal of the therapy is for these symptoms to move closer to your back and eventually resolve. Sometimes, these leg symptoms will get better, but your lower back pain may worsen. This is often an indication of progress in your rehabilitation. Be very alert to any changes in your symptoms and the activities in which you participated in the 24 hours prior to the change. Sharing this information with your caregiver will allow him or her to most efficiently treat your condition. These exercises may help you when beginning to rehabilitate your injury. Your symptoms may resolve with or without further involvement from your physician, physical therapist or athletic trainer. While completing these exercises, remember:  Restoring tissue flexibility helps normal motion to return to the joints. This allows healthier, less painful movement and activity. An effective stretch should be held for at least 30 seconds. A stretch should never be painful. You should only feel a gentle lengthening or release in the stretched tissue. FLEXION RANGE OF MOTION AND STRETCHING EXERCISES:  STRETCH - Flexion, Single Knee to Chest  Lie on a firm bed or floor with both legs extended in front of you. Keeping one leg in contact with the floor, bring your opposite knee to your chest. Hold your leg in place by either  grabbing behind your thigh or at your knee. Pull until you feel a gentle stretch in your low back. Hold 30 seconds. Slowly release your grasp and repeat the exercise with the opposite side. Repeat 2 times. Complete this exercise 3 times per week.   STRETCH - Flexion, Double Knee to Chest Lie on a firm bed or floor with both legs extended in front of you. Keeping one leg in contact with the floor, bring your opposite knee to your chest. Tense your stomach muscles to support your back and then lift your other knee to your chest. Hold your legs in place by either grabbing behind your thighs or at your knees. Pull both knees toward your chest until you feel a gentle stretch in your low back. Hold 30 seconds. Tense your stomach muscles and slowly return one leg at a time to the floor. Repeat 2 times. Complete this exercise 3 times per week.   STRETCH - Low Trunk Rotation Lie on a firm bed or floor. Keeping your legs in front of you, bend your knees so they are both pointed toward the ceiling and your feet are flat on the floor. Extend your arms out to the side. This will stabilize your upper body by keeping your shoulders in contact with the floor. Gently and slowly drop both knees together to one side until you feel a gentle stretch in your low back. Hold for 30 seconds. Tense your stomach muscles to support your lower back as you bring your knees back to the starting position. Repeat  the exercise to the other side. Repeat 2 times. Complete this exercise at least 3 times per week.   EXTENSION RANGE OF MOTION AND FLEXIBILITY EXERCISES:  STRETCH - Extension, Prone on Elbows  Lie on your stomach on the floor, a bed will be too soft. Place your palms about shoulder width apart and at the height of your head. Place your elbows under your shoulders. If this is too painful, stack pillows under your chest. Allow your body to relax so that your hips drop lower and make contact more completely with the  floor. Hold this position for 30 seconds. Slowly return to lying flat on the floor. Repeat 2 times. Complete this exercise 3 times per week.   RANGE OF MOTION - Extension, Prone Press Ups Lie on your stomach on the floor, a bed will be too soft. Place your palms about shoulder width apart and at the height of your head. Keeping your back as relaxed as possible, slowly straighten your elbows while keeping your hips on the floor. You may adjust the placement of your hands to maximize your comfort. As you gain motion, your hands will come more underneath your shoulders. Hold this position 30 seconds. Slowly return to lying flat on the floor. Repeat 2 times. Complete this exercise 3 times per week.   RANGE OF MOTION- Quadruped, Neutral Spine  Assume a hands and knees position on a firm surface. Keep your hands under your shoulders and your knees under your hips. You may place padding under your knees for comfort. Drop your head and point your tailbone toward the ground below you. This will round out your lower back like an angry cat. Hold this position for 30 seconds. Slowly lift your head and release your tail bone so that your back sags into a large arch, like an old horse. Hold this position for 30 seconds. Repeat this until you feel limber in your low back. Now, find your "sweet spot." This will be the most comfortable position somewhere between the two previous positions. This is your neutral spine. Once you have found this position, tense your stomach muscles to support your low back. Hold this position for 30 seconds. Repeat 2 times. Complete this exercise 3 times per week.   STRENGTHENING EXERCISES - Low Back Sprain These exercises may help you when beginning to rehabilitate your injury. These exercises should be done near your "sweet spot." This is the neutral, low-back arch, somewhere between fully rounded and fully arched, that is your least painful position. When performed in this safe  range of motion, these exercises can be used for people who have either a flexion or extension based injury. These exercises may resolve your symptoms with or without further involvement from your physician, physical therapist or athletic trainer. While completing these exercises, remember:  Muscles can gain both the endurance and the strength needed for everyday activities through controlled exercises. Complete these exercises as instructed by your physician, physical therapist or athletic trainer. Increase the resistance and repetitions only as guided. You may experience muscle soreness or fatigue, but the pain or discomfort you are trying to eliminate should never worsen during these exercises. If this pain does worsen, stop and make certain you are following the directions exactly. If the pain is still present after adjustments, discontinue the exercise until you can discuss the trouble with your caregiver.  STRENGTHENING - Deep Abdominals, Pelvic Tilt  Lie on a firm bed or floor. Keeping your legs in front of you, bend  your knees so they are both pointed toward the ceiling and your feet are flat on the floor. Tense your lower abdominal muscles to press your low back into the floor. This motion will rotate your pelvis so that your tail bone is scooping upwards rather than pointing at your feet or into the floor. With a gentle tension and even breathing, hold this position for 3 seconds. Repeat 2 times. Complete this exercise 3 times per week.   STRENGTHENING - Abdominals, Crunches  Lie on a firm bed or floor. Keeping your legs in front of you, bend your knees so they are both pointed toward the ceiling and your feet are flat on the floor. Cross your arms over your chest. Slightly tip your chin down without bending your neck. Tense your abdominals and slowly lift your trunk high enough to just clear your shoulder blades. Lifting higher can put excessive stress on the lower back and does not further  strengthen your abdominal muscles. Control your return to the starting position. Repeat 2 times. Complete this exercise 3 times per week.   STRENGTHENING - Quadruped, Opposite UE/LE Lift  Assume a hands and knees position on a firm surface. Keep your hands under your shoulders and your knees under your hips. You may place padding under your knees for comfort. Find your neutral spine and gently tense your abdominal muscles so that you can maintain this position. Your shoulders and hips should form a rectangle that is parallel with the floor and is not twisted. Keeping your trunk steady, lift your right hand no higher than your shoulder and then your left leg no higher than your hip. Make sure you are not holding your breath. Hold this position for 30 seconds. Continuing to keep your abdominal muscles tense and your back steady, slowly return to your starting position. Repeat with the opposite arm and leg. Repeat 2 times. Complete this exercise 3 times per week.   STRENGTHENING - Abdominals and Quadriceps, Straight Leg Raise  Lie on a firm bed or floor with both legs extended in front of you. Keeping one leg in contact with the floor, bend the other knee so that your foot can rest flat on the floor. Find your neutral spine, and tense your abdominal muscles to maintain your spinal position throughout the exercise. Slowly lift your straight leg off the floor about 6 inches for a count of 3, making sure to not hold your breath. Still keeping your neutral spine, slowly lower your leg all the way to the floor. Repeat this exercise with each leg 2 times. Complete this exercise 3 times per week.  POSTURE AND BODY MECHANICS CONSIDERATIONS - Low Back Sprain Keeping correct posture when sitting, standing or completing your activities will reduce the stress put on different body tissues, allowing injured tissues a chance to heal and limiting painful experiences. The following are general guidelines for  improved posture.  While reading these guidelines, remember: The exercises prescribed by your provider will help you have the flexibility and strength to maintain correct postures. The correct posture provides the best environment for your joints to work. All of your joints have less wear and tear when properly supported by a spine with good posture. This means you will experience a healthier, less painful body. Correct posture must be practiced with all of your activities, especially prolonged sitting and standing. Correct posture is as important when doing repetitive low-stress activities (typing) as it is when doing a single heavy-load activity (lifting).  RESTING  POSITIONS Consider which positions are most painful for you when choosing a resting position. If you have pain with flexion-based activities (sitting, bending, stooping, squatting), choose a position that allows you to rest in a less flexed posture. You would want to avoid curling into a fetal position on your side. If your pain worsens with extension-based activities (prolonged standing, working overhead), avoid resting in an extended position such as sleeping on your stomach. Most people will find more comfort when they rest with their spine in a more neutral position, neither too rounded nor too arched. Lying on a non-sagging bed on your side with a pillow between your knees, or on your back with a pillow under your knees will often provide some relief. Keep in mind, being in any one position for a prolonged period of time, no matter how correct your posture, can still lead to stiffness.  PROPER SITTING POSTURE In order to minimize stress and discomfort on your spine, you must sit with correct posture. Sitting with good posture should be effortless for a healthy body. Returning to good posture is a gradual process. Many people can work toward this most comfortably by using various supports until they have the flexibility and strength to  maintain this posture on their own. When sitting with proper posture, your ears will fall over your shoulders and your shoulders will fall over your hips. You should use the back of the chair to support your upper back. Your lower back will be in a neutral position, just slightly arched. You may place a small pillow or folded towel at the base of your lower back for  support.  When working at a desk, create an environment that supports good, upright posture. Without extra support, muscles tire, which leads to excessive strain on joints and other tissues. Keep these recommendations in mind:  CHAIR: A chair should be able to slide under your desk when your back makes contact with the back of the chair. This allows you to work closely. The chair's height should allow your eyes to be level with the upper part of your monitor and your hands to be slightly lower than your elbows.  BODY POSITION Your feet should make contact with the floor. If this is not possible, use a foot rest. Keep your ears over your shoulders. This will reduce stress on your neck and low back.  INCORRECT SITTING POSTURES  If you are feeling tired and unable to assume a healthy sitting posture, do not slouch or slump. This puts excessive strain on your back tissues, causing more damage and pain. Healthier options include: Using more support, like a lumbar pillow. Switching tasks to something that requires you to be upright or walking. Talking a brief walk. Lying down to rest in a neutral-spine position.  PROLONGED STANDING WHILE SLIGHTLY LEANING FORWARD  When completing a task that requires you to lean forward while standing in one place for a long time, place either foot up on a stationary 2-4 inch high object to help maintain the best posture. When both feet are on the ground, the lower back tends to lose its slight inward curve. If this curve flattens (or becomes too large), then the back and your other joints will experience  too much stress, tire more quickly, and can cause pain.  CORRECT STANDING POSTURES Proper standing posture should be assumed with all daily activities, even if they only take a few moments, like when brushing your teeth. As in sitting, your ears should fall  over your shoulders and your shoulders should fall over your hips. You should keep a slight tension in your abdominal muscles to brace your spine. Your tailbone should point down to the ground, not behind your body, resulting in an over-extended swayback posture.   INCORRECT STANDING POSTURES  Common incorrect standing postures include a forward head, locked knees and/or an excessive swayback. WALKING Walk with an upright posture. Your ears, shoulders and hips should all line-up.  PROLONGED ACTIVITY IN A FLEXED POSITION When completing a task that requires you to bend forward at your waist or lean over a low surface, try to find a way to stabilize 3 out of 4 of your limbs. You can place a hand or elbow on your thigh or rest a knee on the surface you are reaching across. This will provide you more stability, so that your muscles do not tire as quickly. By keeping your knees relaxed, or slightly bent, you will also reduce stress across your lower back. CORRECT LIFTING TECHNIQUES  DO : Assume a wide stance. This will provide you more stability and the opportunity to get as close as possible to the object which you are lifting. Tense your abdominals to brace your spine. Bend at the knees and hips. Keeping your back locked in a neutral-spine position, lift using your leg muscles. Lift with your legs, keeping your back straight. Test the weight of unknown objects before attempting to lift them. Try to keep your elbows locked down at your sides in order get the best strength from your shoulders when carrying an object.   Always ask for help when lifting heavy or awkward objects. INCORRECT LIFTING TECHNIQUES DO NOT:  Lock your knees when lifting,  even if it is a small object. Bend and twist. Pivot at your feet or move your feet when needing to change directions. Assume that you can safely pick up even a paperclip without proper posture.

## 2021-05-09 ENCOUNTER — Other Ambulatory Visit (INDEPENDENT_AMBULATORY_CARE_PROVIDER_SITE_OTHER): Payer: Managed Care, Other (non HMO)

## 2021-05-09 DIAGNOSIS — R319 Hematuria, unspecified: Secondary | ICD-10-CM

## 2021-05-09 DIAGNOSIS — Z0000 Encounter for general adult medical examination without abnormal findings: Secondary | ICD-10-CM

## 2021-05-09 LAB — LIPID PANEL
Cholesterol: 210 mg/dL — ABNORMAL HIGH (ref 0–200)
HDL: 64.3 mg/dL (ref >39.00)
LDL Cholesterol: 126 mg/dL — ABNORMAL HIGH (ref 0–99)
NonHDL: 145.93
Total CHOL/HDL Ratio: 3
Triglycerides: 101 mg/dL (ref 0.0–149.0)
VLDL: 20.2 mg/dL (ref 0.0–40.0)

## 2021-05-09 LAB — HEPATIC FUNCTION PANEL
ALT: 19 U/L (ref 0–35)
AST: 17 U/L (ref 0–37)
Albumin: 4.3 g/dL (ref 3.5–5.2)
Alkaline Phosphatase: 99 U/L (ref 39–117)
Bilirubin, Direct: 0.1 mg/dL (ref 0.0–0.3)
Total Bilirubin: 0.9 mg/dL (ref 0.2–1.2)
Total Protein: 7.2 g/dL (ref 6.0–8.3)

## 2021-05-09 LAB — URINALYSIS, MICROSCOPIC ONLY: RBC / HPF: NONE SEEN (ref 0–?)

## 2021-05-10 LAB — PTH, INTACT AND CALCIUM
Calcium: 10.3 mg/dL (ref 8.6–10.4)
PTH: 36 pg/mL (ref 16–77)

## 2021-08-09 ENCOUNTER — Ambulatory Visit (INDEPENDENT_AMBULATORY_CARE_PROVIDER_SITE_OTHER): Payer: Managed Care, Other (non HMO) | Admitting: Family Medicine

## 2021-08-09 ENCOUNTER — Encounter: Payer: Self-pay | Admitting: Family Medicine

## 2021-08-09 VITALS — BP 118/76 | HR 64 | Temp 98.2°F | Ht 62.0 in | Wt 192.4 lb

## 2021-08-09 DIAGNOSIS — M65331 Trigger finger, right middle finger: Secondary | ICD-10-CM

## 2021-08-09 MED ORDER — PREDNISONE 20 MG PO TABS: 40.0000 mg | ORAL_TABLET | Freq: Every day | ORAL | 0 refills | Status: AC

## 2021-08-09 NOTE — Progress Notes (Signed)
Musculoskeletal Exam  Patient: Dawn Leach DOB: [birth year]  DOS: 08/09/2021  SUBJECTIVE:  Chief Complaint:   Chief Complaint  Patient presents with   Hand Pain    right    Dawn Leach is a 56 y.o.  female for evaluation and treatment of R hand pain.   Onset:  3 weeks ago. No inj or change in activity.  Location: R middle finger Character:  aching, burning, and sharp  Progression of issue:  has slightly improved Associated symptoms: triggering No bruising, swelling, redness, fevers Treatment: to date has been changing her mouse which did help, [place].   Neurovascular symptoms: no  Past Medical History:  Diagnosis Date   Allergy    Anemia    Edema, lower extremity     Objective: VITAL SIGNS: BP 118/76   Pulse 64   Temp 98.2 F (36.8 C) (Oral)   Ht '5\' 2"'$  (1.575 m)   Wt 192 lb 6 oz (87.3 kg)   SpO2 99%   BMI 35.19 kg/m  Constitutional: Well formed, well developed. No acute distress. Thorax & Lungs: No accessory muscle use Musculoskeletal: R hand.   Normal active range of motion: yes.   Normal passive range of motion: yes Tenderness to palpation: yes over palmar surface of 3rd digit flexor tendon; did not appreciate any triggering or a nodule Deformity: no Ecchymosis: no Neurologic: Normal sensory function. Psychiatric: Normal mood. Age appropriate judgment and insight. Alert & oriented x 3.    Assessment:  Trigger middle finger of right hand - Plan: predniSONE (DELTASONE) 20 MG tablet  Plan: 5 d pred burst 40 mg/d. Splinting the finger, ice, Tylenol.  F/u in 1 mo prn. The patient voiced understanding and agreement to the plan.   [place], DO 08/09/21  4:37 PM

## 2021-08-09 NOTE — Patient Instructions (Addendum)
Ice/cold pack over area for 10-15 min twice daily.  OK to take Tylenol 1000 mg (2 extra strength tabs) or 975 mg (3 regular strength tabs) every 6 hours as needed.  OK to continue [facility].  Get a metal finger splint that helps prevent bending of the middle finger. Use for 3-4 weeks. If no improvement, we may have to consider an injection. Use athletic tape for your splint if is doesn't attach to the finger.   Let us know if you need anything.

## 2021-10-09 ENCOUNTER — Encounter (INDEPENDENT_AMBULATORY_CARE_PROVIDER_SITE_OTHER): Payer: Self-pay

## 2021-11-15 ENCOUNTER — Ambulatory Visit: Payer: BC Managed Care – PPO | Admitting: Family Medicine

## 2021-11-15 ENCOUNTER — Encounter: Payer: Self-pay | Admitting: Family Medicine

## 2021-11-15 VITALS — BP 116/64 | HR 61 | Temp 97.6°F | Resp 16 | Ht 62.0 in | Wt 193.6 lb

## 2021-11-15 DIAGNOSIS — Z23 Encounter for immunization: Secondary | ICD-10-CM | POA: Diagnosis not present

## 2021-11-15 DIAGNOSIS — Z111 Encounter for screening for respiratory tuberculosis: Secondary | ICD-10-CM

## 2021-11-15 DIAGNOSIS — Z0289 Encounter for other administrative examinations: Secondary | ICD-10-CM

## 2021-11-15 NOTE — Patient Instructions (Signed)
Give us 2-3 business days to get the results of your labs back.  Let us know if you need anything.  

## 2021-11-15 NOTE — Progress Notes (Signed)
Chief Complaint  Patient presents with   PPD Reading    Here for TB     Subjective: Patient is a 56 y.o. female here for completion of form.  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  She needs a form filled out.  She denies any weakness, balance issues, chest pain, shortness of breath, vision changes, or hearing loss.  She has not been to any TB endemic areas.  She has never been diagnosed with TB.  She is up-to-date with her vaccinations.  Past Medical History:  Diagnosis Date   Allergy    Anemia    Edema, lower extremity     Objective: BP 116/64 (BP Location: Right Arm, Patient Position: Sitting, Cuff Size: Normal)   Pulse 61   Temp 97.6 F (36.4 C) (Oral)   Resp 16   Ht '5\' 2"'$  (1.575 m)   Wt 193 lb 9.6 oz (87.8 kg)   SpO2 98%   BMI 35.41 kg/m  General: Awake, appears stated age Heart: RRR, no LE edema Lungs: CTAB, no rales, wheezes or rhonchi. No accessory muscle use Neuro: DTRs equal and symmetric throughout, grip strength adequate, 5/5 strength in the lower extremities bilaterally Ears: Canals are patent, TMs negative bilaterally Eyes: Sclera white, PERRLA, EOMI Psych: Age appropriate judgment and insight, normal affect and mood  Assessment and Plan: Encounter for completion of form with patient  Screening-pulmonary TB - Plan: QuantiFERON-TB Gold Plus  Need for influenza vaccination - Plan: Flu Vaccine QUAD 6+ mos PF IM (Fluarix Quad PF)  Form filled out, will await QuantiFERON gold results.  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  Follow-up as originally scheduled. The patient voiced understanding and agreement to the plan.  I spent 15 minutes with the patient discussing the above plan and filling out paperwork and reviewing her chart and same day of the visit.  [place], DO 11/15/21  3:31 PM

## 2021-11-18 ENCOUNTER — Other Ambulatory Visit: Payer: BC Managed Care – PPO

## 2021-11-18 ENCOUNTER — Telehealth: Payer: Self-pay | Admitting: *Deleted

## 2021-11-18 DIAGNOSIS — Z111 Encounter for screening for respiratory tuberculosis: Secondary | ICD-10-CM

## 2021-11-18 NOTE — Telephone Encounter (Signed)
TB gold specimen from Friday did not get picked up.  Specimen is no longer stable and will need to be recollected.  Left message for pt to return my call.

## 2021-11-18 NOTE — Addendum Note (Signed)
Addended by: Kelle Darting A on: 11/18/2021 02:24 PM   Modules accepted: Orders

## 2021-11-18 NOTE — Telephone Encounter (Signed)
Advised patient of message, lab appt made for today @ 02:15pm

## 2021-11-18 NOTE — Progress Notes (Signed)
No charge. Pt here for redraw due to specimen not getting picked up on Friday.

## 2021-11-21 ENCOUNTER — Telehealth: Payer: Self-pay | Admitting: *Deleted

## 2021-11-21 ENCOUNTER — Ambulatory Visit: Payer: BC Managed Care – PPO | Admitting: Family Medicine

## 2021-11-21 ENCOUNTER — Ambulatory Visit: Payer: BC Managed Care – PPO

## 2021-11-21 ENCOUNTER — Telehealth: Payer: Self-pay | Admitting: Family Medicine

## 2021-11-21 ENCOUNTER — Encounter: Payer: Self-pay | Admitting: Family Medicine

## 2021-11-21 VITALS — BP 100/60 | HR 95 | Temp 97.6°F | Ht 62.0 in | Wt 192.4 lb

## 2021-11-21 DIAGNOSIS — Z0289 Encounter for other administrative examinations: Secondary | ICD-10-CM

## 2021-11-21 LAB — QUANTIFERON-TB GOLD PLUS

## 2021-11-21 NOTE — Telephone Encounter (Signed)
Patient called to request callback from Rod Holler regarding her visit today.

## 2021-11-21 NOTE — Progress Notes (Signed)
Pt came in to get a form that she made an appt for last week. Will not charge her. She will come in tomorrow for a skin test as the lab test is taking a while to return. If it is not back by [facility], she will return then for the read.

## 2021-11-21 NOTE — Telephone Encounter (Signed)
Patient spoke to [employer]

## 2021-11-21 NOTE — Telephone Encounter (Signed)
Per verbal from CMA, Dawn Leach pt is requesting to cancel TB gold blood test and change to skin test.  Spoke with Nevin Bloodgood at [facility] and cancelled blood test from 11/18/21.

## 2021-11-22 ENCOUNTER — Ambulatory Visit (INDEPENDENT_AMBULATORY_CARE_PROVIDER_SITE_OTHER): Payer: BC Managed Care – PPO

## 2021-11-22 DIAGNOSIS — Z111 Encounter for screening for respiratory tuberculosis: Secondary | ICD-10-CM

## 2021-11-22 NOTE — Progress Notes (Signed)
PPD Placement note Marlis Edelson, 56 y.o. female is here today for placement of PPD test Reason for PPD test: Pt request Pt taken PPD test before: no Verified in allergy area and with patient that they are not allergic to the products PPD is made of (Phenol or Tween). Yes Is patient taking any oral or IV steroid medication now or have they taken it in the last month? no Has the patient ever received the BCG vaccine?: no Has the patient been in recent contact with anyone known or suspected of having active TB disease?: no      Date of exposure (if applicable):       Name of person they were exposed to (if applicable): na Patient's Country of origin?: [place] O: Alert and oriented in NAD. P:  PPD placed on 11/22/2021.  Patient advised to return for reading within 48-72 hours.

## 2021-11-25 ENCOUNTER — Ambulatory Visit: Payer: BC Managed Care – PPO

## 2021-11-25 ENCOUNTER — Encounter: Payer: Self-pay | Admitting: Family Medicine

## 2021-11-25 DIAGNOSIS — Z111 Encounter for screening for respiratory tuberculosis: Secondary | ICD-10-CM

## 2021-11-25 LAB — TB SKIN TEST
Induration: NEGATIVE mm
TB Skin Test: NEGATIVE

## 2021-11-25 NOTE — Progress Notes (Signed)
Patient here for TB skin test read

## 2022-01-15 ENCOUNTER — Encounter: Payer: Self-pay | Admitting: General Practice

## 2022-03-05 ENCOUNTER — Telehealth (HOSPITAL_BASED_OUTPATIENT_CLINIC_OR_DEPARTMENT_OTHER): Payer: Self-pay

## 2022-03-11 ENCOUNTER — Other Ambulatory Visit (HOSPITAL_BASED_OUTPATIENT_CLINIC_OR_DEPARTMENT_OTHER): Payer: Self-pay | Admitting: Family Medicine

## 2022-03-11 ENCOUNTER — Emergency Department (HOSPITAL_COMMUNITY)
Admission: EM | Admit: 2022-03-11 | Discharge: 2022-03-11 | Discharge status: Against Medical Advice | Payer: BC Managed Care – PPO

## 2022-03-11 DIAGNOSIS — Z1231 Encounter for screening mammogram for malignant neoplasm of breast: Secondary | ICD-10-CM

## 2022-03-11 NOTE — ED Notes (Signed)
Pt states they are leaving  

## 2022-03-12 ENCOUNTER — Ambulatory Visit: Payer: BC Managed Care – PPO | Admitting: Family Medicine

## 2022-03-12 ENCOUNTER — Encounter: Payer: Self-pay | Admitting: Family Medicine

## 2022-03-12 ENCOUNTER — Telehealth: Payer: Self-pay

## 2022-03-12 VITALS — BP 114/70 | HR 55 | Temp 98.0°F | Ht 62.0 in | Wt 198.5 lb

## 2022-03-12 DIAGNOSIS — K146 Glossodynia: Secondary | ICD-10-CM

## 2022-03-12 NOTE — Progress Notes (Signed)
   Established Patient Office Visit  Subjective   Patient ID: Dawn Leach, female    DOB: 03/30/1965  Age: 57 y.o. MRN: [id number]  Chief Complaint  Patient presents with   Lip Laceration    Patient complains of tongue injury, x3 weeks     HPI   Dawn Leach is seen with hand injury which occurred about 4 weeks ago.  She was in a hurry to eat and was eating a microwave dinner and burned the left distal tip of her tongue.  Since that time she has had a little bit of increased sensitivity in that area and she noticed a little bit of prominence of papillae in that region.  No fibroma.  No ulceration.  She is a non-smoker.  Past Medical History:  Diagnosis Date   Allergy    Anemia    Edema, lower extremity    Past Surgical History:  Procedure Laterality Date   NO PAST SURGERIES      reports that she has never smoked. She has never used smokeless tobacco. She reports that she does not drink alcohol and does not use drugs. family history includes Alcoholism in her father; Cancer in her mother; Colon cancer in her mother; Depression in her mother; Diabetes in her father; Heart disease in her father and mother; High Cholesterol in her father and mother; Hypertension in her father and mother; Obesity in her mother; Stroke in her mother. Allergies  Allergen Reactions   Cocos Nucifera    Penicillins Other (See Comments)    seizures   Shellfish Allergy     Review of Systems  Constitutional:  Negative for chills, fever and weight loss.  HENT:  Negative for sore throat.       Objective:     BP 114/70 (BP Location: Left Arm, Patient Position: Sitting, Cuff Size: Normal)   Pulse (!) 55   Temp 98 F (36.7 C) (Oral)   Ht '5\' 2"'$  (1.575 m)   Wt 198 lb 8 oz (90 kg)   SpO2 98%   BMI 36.31 kg/m    Physical Exam Vitals reviewed.  Constitutional:      Appearance: Normal appearance.  HENT:     Mouth/Throat:     Mouth: Mucous membranes are moist.     Pharynx: Oropharynx is clear. No  oropharyngeal exudate or posterior oropharyngeal erythema.     Comments: Oropharynx is examined.  She has couple of somewhat prominent papillae distal tongue but no evidence for any growth or mass.  No fibroma.  No ulceration.  No increased erythema.  No other intraoral lesions noted.  Distal tongue to palpation reveals no induration. Cardiovascular:     Rate and Rhythm: Normal rate and regular rhythm.  Neurological:     Mental Status: She is alert.      No results found for any visits on 03/12/22.    The 10-year ASCVD risk score (Arnett DK, et al., 2019) is: 1.6%    Assessment & Plan:   Recent tongue injury with reported burn.  This appears to be healed at this time.  No fibroma, ulceration, leukoplakia, or erythroplakia or other concerns.  Recommend observation for now.  Follow-up for any changes in exam.  We reviewed signs and symptoms of intraoral cancers  Carolann Littler, MD

## 2022-03-12 NOTE — Patient Instructions (Signed)
Give this another 3-4 weeks and be in touch for any increased pain or any signs of ulceration or nodules

## 2022-03-12 NOTE — Telephone Encounter (Signed)
Nurse Assessment Nurse: Rose Phi, RN, [place] Date/Time (Eastern Time): 03/11/2022 5:08:59 PM Confirm and document reason for call. If symptomatic, describe symptoms. ---Caller states she burned her tongue and there was an abscess that she cut off with shears. She is having some bleeding. Does the patient have any new or worsening symptoms? ---Yes Will a triage be completed? ---Yes Related visit to physician within the last 2 weeks? ---No Does the PT have any chronic conditions? (i.e. diabetes, asthma, this includes High risk factors for pregnancy, etc.) ---No Is this a behavioral health or substance abuse call? ---No Guidelines Guideline Title Affirmed Question Affirmed Notes Nurse Date/Time (Eastern Time) Mouth Injury Cut of side or tip of TONGUE that gapes open (split tongue) Overcast, RN, [place] 03/11/2022 5:10:08 PM Disp. Time Eilene Ghazi Time) Disposition Final User 03/11/2022 5:07:47 PM Send to Urgent Queue Lennie Muckle 03/11/2022 5:11:51 PM Go to ED Now Yes Rose Phi, RN, [place] Final Disposition 03/11/2022 5:11:51 PM Go to ED Now Yes Overcast, RN, [place] PLEASE NOTE: All timestamps contained within this report are represented as [place] Standard Time. CONFIDENTIALTY NOTICE: This fax transmission is intended only for the addressee. It contains information that is legally privileged, confidential or otherwise protected from use or disclosure. If you are not the intended recipient, you are strictly prohibited from reviewing, disclosing, copying using or disseminating any of this information or taking any action in reliance on or regarding this information. If you have received this fax in error, please notify us immediately by telephone so that we can arrange for its return to [place]. Phone: [phone], Toll-Free: [phone], Fax: [phone] Page: 2 of 2 Call Id: [id number] Caller Disagree/Comply Comply Caller Understands Yes PreDisposition InappropriateToAsk Care Advice  Given Per Guideline GO TO ED NOW: * You need to be seen in the Emergency Department. * Go to the ED at ___________ [place] now. Drive carefully. CARE ADVICE per Mouth Injury (Adult) guideline. Referrals [facility] - ED

## 2022-03-12 NOTE — Telephone Encounter (Signed)
Called left detailed message to call the office and schedule OV with PCP

## 2022-03-12 NOTE — Telephone Encounter (Signed)
Appt scheduled w/ Dr. Elease Hashimoto 03/12/22.

## 2022-03-19 ENCOUNTER — Telehealth (HOSPITAL_BASED_OUTPATIENT_CLINIC_OR_DEPARTMENT_OTHER): Payer: Self-pay

## 2022-05-09 ENCOUNTER — Encounter: Payer: BC Managed Care – PPO | Admitting: Family Medicine

## 2022-05-26 ENCOUNTER — Encounter: Payer: Self-pay | Admitting: Family Medicine

## 2022-05-26 ENCOUNTER — Ambulatory Visit (INDEPENDENT_AMBULATORY_CARE_PROVIDER_SITE_OTHER): Payer: BC Managed Care – PPO | Admitting: Family Medicine

## 2022-05-26 ENCOUNTER — Other Ambulatory Visit: Payer: Self-pay | Admitting: Family Medicine

## 2022-05-26 VITALS — BP 108/70 | HR 60 | Temp 97.5°F | Ht 62.0 in | Wt 191.2 lb

## 2022-05-26 DIAGNOSIS — R7303 Prediabetes: Secondary | ICD-10-CM

## 2022-05-26 DIAGNOSIS — Z0000 Encounter for general adult medical examination without abnormal findings: Secondary | ICD-10-CM | POA: Diagnosis not present

## 2022-05-26 DIAGNOSIS — R718 Other abnormality of red blood cells: Secondary | ICD-10-CM

## 2022-05-26 LAB — CBC
HCT: 38.6 % (ref 36.0–46.0)
Hemoglobin: 12.6 g/dL (ref 12.0–15.0)
MCHC: 32.7 g/dL (ref 30.0–36.0)
MCV: 82.1 fl (ref 78.0–100.0)
Platelets: 271 10*3/uL (ref 150.0–400.0)
RBC: 4.71 Mil/uL (ref 3.87–5.11)
RDW: 15.9 % — ABNORMAL HIGH (ref 11.5–15.5)
WBC: 4 10*3/uL (ref 4.0–10.5)

## 2022-05-26 LAB — LIPID PANEL
Cholesterol: 211 mg/dL — ABNORMAL HIGH (ref 0–200)
HDL: 65.4 mg/dL (ref >39.00)
LDL Cholesterol: 119 mg/dL — ABNORMAL HIGH (ref 0–99)
NonHDL: 145.46
Total CHOL/HDL Ratio: 3
Triglycerides: 133 mg/dL (ref 0.0–149.0)
VLDL: 26.6 mg/dL (ref 0.0–40.0)

## 2022-05-26 LAB — COMPREHENSIVE METABOLIC PANEL
ALT: 17 U/L (ref 0–35)
AST: 16 U/L (ref 0–37)
Albumin: 4.4 g/dL (ref 3.5–5.2)
Alkaline Phosphatase: 102 U/L (ref 39–117)
BUN: 13 mg/dL (ref 6–23)
CO2: 27 mEq/L (ref 19–32)
Calcium: 10.3 mg/dL (ref 8.4–10.5)
Chloride: 104 mEq/L (ref 96–112)
Creatinine, Ser: 0.95 mg/dL (ref 0.40–1.20)
GFR: 66.92 mL/min (ref >60.00)
Glucose, Bld: 87 mg/dL (ref 70–99)
Potassium: 4.5 mEq/L (ref 3.5–5.1)
Sodium: 139 mEq/L (ref 135–145)
Total Bilirubin: 0.6 mg/dL (ref 0.2–1.2)
Total Protein: 7.5 g/dL (ref 6.0–8.3)

## 2022-05-26 LAB — HEMOGLOBIN A1C: Hgb A1c MFr Bld: 6.1 % (ref 4.6–6.5)

## 2022-05-26 NOTE — Progress Notes (Signed)
Chief Complaint  Patient presents with   Annual Exam     Well Woman Dawn Leach is here for a complete physical.   Her last physical was >1 year ago.  Current diet: in general, a "very healthy" diet. Current exercise: walking, wt resistance exercise. Weight is stable and she denies fatigue out of ordinary. Seatbelt? Yes Advanced directive? Yes  Health Maintenance Pap/HPV- Yes Mammogram- Yes Colon cancer screening-Yes Shingrix- Yes Tetanus- Yes Hep C screening- Yes HIV screening- Yes  Past Medical History:  Diagnosis Date   Allergy    Anemia    Edema, lower extremity      Past Surgical History:  Procedure Laterality Date   NO PAST SURGERIES      Medications  Current Outpatient Medications on File Prior to Visit  Medication Sig Dispense Refill   Cholecalciferol (VITAMIN D3) 5000 units CAPS Take 5,000 Units by mouth daily.     Multiple Vitamin (MULTIVITAMIN) tablet Take 1 tablet by mouth daily.     Allergies Allergies  Allergen Reactions   Cocos Nucifera    Penicillins Other (See Comments)    seizures   Shellfish Allergy    Review of Systems: Constitutional:  no unexpected weight changes Eye:  no recent significant change in vision Ear/Nose/Mouth/Throat:  Ears:  no recent change in hearing Nose/Mouth/Throat:  no complaints of nasal congestion, no sore throat Cardiovascular: no chest pain Respiratory:  no shortness of breath Gastrointestinal:  no abdominal pain, no change in bowel habits GU:  Female: negative for dysuria or pelvic pain Musculoskeletal/Extremities: +R ankle pain Integumentary (Skin/Breast):  no abnormal skin lesions reported Neurologic:  no headaches Endocrine:  denies fatigue  Exam BP 108/70 (BP Location: Left Arm, Patient Position: Sitting, Cuff Size: Normal)   Pulse 60   Temp (!) 97.5 F (36.4 C) (Oral)   Ht 5\' 2"  (1.575 m)   Wt 191 lb 4 oz (86.8 kg)   SpO2 99%   BMI 34.98 kg/m  General:  well developed, well nourished, in no  apparent distress Skin:  no significant moles, warts, or growths Head:  no masses, lesions, or tenderness Eyes:  pupils equal and round, sclera anicteric without injection Ears:  canals without lesions, TMs shiny without retraction, no obvious effusion, no erythema Nose:  nares patent, mucosa normal, and no drainage  Throat/Pharynx:  lips and gingiva without lesion; tongue and uvula midline; non-inflamed pharynx; no exudates or postnasal drainage Neck: neck supple without adenopathy, thyromegaly, or masses Lungs:  clear to auscultation, breath sounds equal bilaterally, no respiratory distress Cardio:  regular rate and rhythm, no LE edema Abdomen:  abdomen soft, nontender; bowel sounds normal; no masses or organomegaly Genital: Defer to GYN Musculoskeletal: +ttp over R deltoid lig. Neg squeeze and ant drawer of R ankle; symmetrical muscle groups noted without atrophy or deformity Extremities:  no clubbing, cyanosis, or edema, no deformities, no skin discoloration Neuro:  gait normal; deep tendon reflexes normal and symmetric Psych: well oriented with normal range of affect and appropriate judgment/insight  Assessment and Plan  Well adult exam - Plan: CBC, Comprehensive metabolic panel, Lipid panel  Prediabetes - Plan: Hemoglobin A1c   Well 57 y.o. female. Counseled on diet and exercise. Advanced directive form requested today.  Other orders as above. Follow up in 1 yr. The patient voiced understanding and agreement to the plan.  [place], DO 05/26/22 1:50 PM

## 2022-05-26 NOTE — Patient Instructions (Addendum)
Give Dawn Leach 2-3 business days to get the results of your labs back.   Keep the diet clean and stay active.  Please get me a copy of your advanced directive form at your convenience.   Heat (pad or rice pillow in microwave) over affected area, 10-15 minutes twice daily.   Ice/cold pack over area for 10-15 min twice daily.  OK to take Tylenol 1000 mg (2 extra strength tabs) or 975 mg (3 regular strength tabs) every 6 hours as needed.  Consider Powerstep insoles. There are very quality over the counter inserts. Shop around online and in stores. Dr. Felicie Morn is a cheaper alternative, though is not as high of quality.   Send me a message in 3-4 weeks if no improvement and we will set you up with the sports medicine team.   Let Dawn Leach know if you need anything.  Ankle Exercises It is normal to feel mild stretching, pulling, tightness, or discomfort as you do these exercises, but you should stop right away if you feel sudden pain or your pain gets worse.  Stretching and range of motion exercises These exercises warm up your muscles and joints and improve the movement and flexibility of your ankle. These exercises also help to relieve pain, numbness, and tingling. Exercise A: Dorsiflexion/Plantar Flexion    Sit with your affected knee straight or bent. Do not rest your foot on anything. Flex your affected ankle to tilt the top of your foot toward your shin. Hold this position for 5 seconds. Point your toes downward to tilt the top of your foot away from your shin. Hold this position for 5 seconds. Repeat 2 times. Complete this exercise 3 times per week. Exercise B: Ankle Alphabet    Sit with your affected foot supported at your lower leg. Do not rest your foot on anything. Make sure your foot has room to move freely. Think of your affected foot as a paintbrush, and move your foot to trace each letter of the alphabet in the air. Keep your hip and knee still while you trace. Make the letters as  large as you can without increasing any discomfort. Trace every letter from A to Z. Repeat 2 times. Complete this exercise 3 times per week. Strengthening exercises These exercises build strength and endurance in your ankle. Endurance is the ability to use your muscles for a long time, even after they get tired. Exercise D: Dorsiflexors    Secure a rubber exercise band or tube to an object, such as a table leg, that will stay still when the band is pulled. Secure the other end around your affected foot. Sit on the floor, facing the object with your affected leg extended. The band or tube should be slightly tense when your foot is relaxed. Slowly flex your affected ankle and toes to bring your foot toward you. Hold this position for 3 seconds.  Slowly return your foot to the starting position, controlling the band as you do that. Do a total of 10 repetitions. Repeat 2 times. Complete this exercise 3 times per week. Exercise E: Plantar Flexors    Sit on the floor with your affected leg extended. Loop a rubber exercise band or tube around the ball of your affected foot. The ball of your foot is on the walking surface, right under your toes. The band or tube should be slightly tense when your foot is relaxed. Slowly point your toes downward, pushing them away from you. Hold this position for 3 seconds.  Slowly release the tension in the band or tube, controlling smoothly until your foot is back in the starting position. Repeat for a total of 10 repetitions. Repeat 2 times. Complete this exercise 3 times per week. Exercise F: Towel Curls    Sit in a chair on a non-carpeted surface, and put your feet on the floor. Place a towel in front of your feet.  Keeping your heel on the floor, put your affected foot on the towel. Pull the towel toward you by grabbing the towel with your toes and curling them under. Keep your heel on the floor. Let your toes relax. Grab the towel again. Keep going until  the towel is completely underneath your foot. Repeat for a total of 10 repetitions. Repeat 2 times. Complete this exercise 3 times per week. Exercise G: Heel Raise ( Plantar Flexors, Standing)     Stand with your feet shoulder-width apart. Keep your weight spread evenly over the width of your feet while you rise up on your toes. Use a wall or table to steady yourself, but try not to use it for support. If this exercise is too easy, try these options: Shift your weight toward your affected leg until you feel challenged. If told by your health care provider, lift your uninjured leg off the floor. Hold this position for 3 seconds. Repeat for a total of 10 repetitions. Repeat 2 times. Complete this exercise 3 times per week. Exercise H: Tandem Walking Stand with one foot directly in front of the other. Slowly raise your back foot up, lifting your heel before your toes, and place it directly in front of your other foot. Continue to walk in this heel-to-toe way for 10 steps or for as long as told by your health care provider. Have a countertop or wall nearby to use if needed to keep your balance, but try not to hold onto anything for support. Repeat 2 times. Complete this exercises 3 times per week. Make sure you discuss any questions you have with your health care provider. Document Released: 01/01/2005 Document Revised: 10/18/2015 Document Reviewed: 11/05/2014 Elsevier Interactive Patient Education  2018 [employer].

## 2022-05-27 ENCOUNTER — Telehealth: Payer: Self-pay | Admitting: Family Medicine

## 2022-05-27 ENCOUNTER — Other Ambulatory Visit: Payer: Self-pay | Admitting: Family Medicine

## 2022-05-27 ENCOUNTER — Other Ambulatory Visit: Payer: BC Managed Care – PPO

## 2022-05-27 DIAGNOSIS — E785 Hyperlipidemia, unspecified: Secondary | ICD-10-CM

## 2022-05-27 DIAGNOSIS — R718 Other abnormality of red blood cells: Secondary | ICD-10-CM

## 2022-05-27 NOTE — Telephone Encounter (Signed)
The patient scheduled herself an appt on 06/02/22 for fasting labs. PCP requested that appt be just a LAB appointment/patient was just seen in the office on 05/26/22. Lab appointment has been scheduled for 06/02/22 at 7:30 AM. Called the patient (left a msg to call back) to inform that 06/02/22 appointment will just be a lab appt// no OV needed.

## 2022-05-28 ENCOUNTER — Ambulatory Visit (HOSPITAL_BASED_OUTPATIENT_CLINIC_OR_DEPARTMENT_OTHER): Payer: BC Managed Care – PPO

## 2022-05-28 ENCOUNTER — Ambulatory Visit: Payer: BC Managed Care – PPO | Admitting: Family Medicine

## 2022-05-28 LAB — IRON,TIBC AND FERRITIN PANEL
%SAT: 18 % (calc) (ref 16–45)
Ferritin: 58 ng/mL (ref 16–232)
Iron: 59 ug/dL (ref 45–160)
TIBC: 331 mcg/dL (calc) (ref 250–450)

## 2022-06-02 ENCOUNTER — Other Ambulatory Visit (INDEPENDENT_AMBULATORY_CARE_PROVIDER_SITE_OTHER): Payer: BC Managed Care – PPO

## 2022-06-02 ENCOUNTER — Other Ambulatory Visit: Payer: Self-pay | Admitting: Family Medicine

## 2022-06-02 ENCOUNTER — Ambulatory Visit: Payer: BC Managed Care – PPO | Admitting: Family Medicine

## 2022-06-02 DIAGNOSIS — Z0189 Encounter for other specified special examinations: Secondary | ICD-10-CM

## 2022-06-02 DIAGNOSIS — E785 Hyperlipidemia, unspecified: Secondary | ICD-10-CM

## 2022-06-02 LAB — LIPID PANEL
Cholesterol: 214 mg/dL — ABNORMAL HIGH (ref 0–200)
HDL: 64.2 mg/dL (ref >39.00)
LDL Cholesterol: 132 mg/dL — ABNORMAL HIGH (ref 0–99)
NonHDL: 150.08
Total CHOL/HDL Ratio: 3
Triglycerides: 92 mg/dL (ref 0.0–149.0)
VLDL: 18.4 mg/dL (ref 0.0–40.0)

## 2022-06-02 LAB — BASIC METABOLIC PANEL
BUN: 14 mg/dL (ref 6–23)
CO2: 24 mEq/L (ref 19–32)
Calcium: 9.8 mg/dL (ref 8.4–10.5)
Chloride: 105 mEq/L (ref 96–112)
Creatinine, Ser: 0.95 mg/dL (ref 0.40–1.20)
GFR: 66.91 mL/min (ref >60.00)
Glucose, Bld: 92 mg/dL (ref 70–99)
Potassium: 4.2 mEq/L (ref 3.5–5.1)
Sodium: 136 mEq/L (ref 135–145)

## 2022-06-03 ENCOUNTER — Ambulatory Visit (HOSPITAL_BASED_OUTPATIENT_CLINIC_OR_DEPARTMENT_OTHER): Payer: Self-pay

## 2022-06-04 ENCOUNTER — Ambulatory Visit (INDEPENDENT_AMBULATORY_CARE_PROVIDER_SITE_OTHER): Payer: BC Managed Care – PPO | Admitting: Obstetrics & Gynecology

## 2022-06-04 ENCOUNTER — Ambulatory Visit (HOSPITAL_BASED_OUTPATIENT_CLINIC_OR_DEPARTMENT_OTHER)
Admission: RE | Admit: 2022-06-04 | Discharge: 2022-06-04 | Disposition: A | Discharge status: Home / Self Care | Payer: BC Managed Care – PPO | Source: Ambulatory Visit | Attending: Family Medicine | Admitting: Family Medicine

## 2022-06-04 ENCOUNTER — Other Ambulatory Visit (HOSPITAL_COMMUNITY)
Admission: RE | Admit: 2022-06-04 | Discharge: 2022-06-04 | Disposition: A | Discharge status: Home / Self Care | Payer: BC Managed Care – PPO | Source: Ambulatory Visit | Attending: Obstetrics & Gynecology | Admitting: Obstetrics & Gynecology

## 2022-06-04 ENCOUNTER — Encounter (HOSPITAL_BASED_OUTPATIENT_CLINIC_OR_DEPARTMENT_OTHER): Payer: Self-pay

## 2022-06-04 ENCOUNTER — Encounter: Payer: Self-pay | Admitting: Obstetrics & Gynecology

## 2022-06-04 VITALS — BP 109/72 | HR 59 | Wt 193.0 lb

## 2022-06-04 DIAGNOSIS — E7800 Pure hypercholesterolemia, unspecified: Secondary | ICD-10-CM | POA: Diagnosis not present

## 2022-06-04 DIAGNOSIS — Z01419 Encounter for gynecological examination (general) (routine) without abnormal findings: Secondary | ICD-10-CM

## 2022-06-04 DIAGNOSIS — Z1231 Encounter for screening mammogram for malignant neoplasm of breast: Secondary | ICD-10-CM | POA: Diagnosis not present

## 2022-06-04 NOTE — Progress Notes (Signed)
Subjective:     Dawn Leach is a 57 y.o. female here for a routine exam.  Current complaints: Told by primary care that she has elevated cholesterol. She is questioning whether to take meds. Wants second opinion. Father died of MI in his 60's. She is otherwise healthy and tries to exercise regularly.    Pt has IUD wants to know when to remove it. She is not having an sx at present.    Gynecologic History No LMP recorded. (Menstrual status: IUD). Contraception: post menopausal status Last Pap: 03/27/2021. Results were: normal Last mammogram: 03/18/2021. Results were: normal  Obstetric History OB History  Gravida Para Term Preterm AB Living  2 1 1  0 1 1  SAB IAB Ectopic Multiple Live Births  1 0 0 0 1    # Outcome Date GA Lbr Len/2nd Weight Sex Delivery Anes PTL Lv  2 SAB      SAB     1 Term      Vag-Spont   LIV     The following portions of the patient's history were reviewed and updated as appropriate: allergies, current medications, past family history, past medical history, past social history, past surgical history, and problem list.  Review of Systems Pertinent items are noted in HPI.    Objective:  BP 109/72   Pulse (!) 59   Wt 193 lb (87.5 kg)   BMI 35.30 kg/m  General Appearance:    Alert, cooperative, no distress, appears stated age  Head:    Normocephalic, without obvious abnormality, atraumatic  Eyes:    conjunctiva/corneas clear, EOM's intact, both eyes  Ears:    Normal external ear canals, both ears  Nose:   Nares normal, septum midline, mucosa normal, no drainage    or sinus tenderness  Throat:   Lips, mucosa, and tongue normal; teeth and gums normal  Neck:   Supple, symmetrical, trachea midline, no adenopathy;    thyroid:  no enlargement/tenderness/nodules  Back:     Symmetric, no curvature, ROM normal, no CVA tenderness  Lungs:     respirations unlabored  Chest Wall:    No tenderness or deformity   Heart:    Regular rate and rhythm  Breast Exam:    No  tenderness, masses, or nipple abnormality  Abdomen:     Soft, non-tender, bowel sounds active all four quadrants,    no masses, no organomegaly  Genitalia:    Normal female without lesion, discharge or tenderness   IUD strings noted  Extremities:   Extremities normal, atraumatic, no cyanosis or edema  Pulses:   2+ and symmetric all extremities  Skin:   Skin color, texture, turgor normal, no rashes or lesions   Assessment:    Healthy female exam.  Pt had questions about how to manage her elevated cholesterol   Reviewed current limits of LnIUD Pt may keep up to 7 years.  She will keep it for now as she does not want to restart bleeding. We do believe that she is in menopause at present. Plan:  [place] was seen today for gynecologic exam.  Diagnoses and all orders for this visit:  Well female exam with routine gynecological exam -     Cytology - PAP( [place])  Elevated cholesterol -     Ambulatory referral to Cardiology   F/u in 1 year or sooner prn   Odies Desa L. Harraway-Smith, M.D., [place]

## 2022-06-06 LAB — CYTOLOGY - PAP
Comment: NEGATIVE
Diagnosis: NEGATIVE
High risk HPV: NEGATIVE

## 2022-08-05 ENCOUNTER — Encounter: Payer: Self-pay | Admitting: Cardiology

## 2022-08-05 ENCOUNTER — Ambulatory Visit: Payer: BC Managed Care – PPO | Attending: Cardiology | Admitting: Cardiology

## 2022-12-02 ENCOUNTER — Ambulatory Visit: Payer: BC Managed Care – PPO | Admitting: Sports Medicine

## 2022-12-02 ENCOUNTER — Other Ambulatory Visit (HOSPITAL_BASED_OUTPATIENT_CLINIC_OR_DEPARTMENT_OTHER): Payer: Self-pay

## 2022-12-02 ENCOUNTER — Encounter: Payer: Self-pay | Admitting: Sports Medicine

## 2022-12-02 VITALS — BP 110/76 | Ht 62.0 in | Wt 189.0 lb

## 2022-12-02 DIAGNOSIS — M1712 Unilateral primary osteoarthritis, left knee: Secondary | ICD-10-CM | POA: Diagnosis not present

## 2022-12-02 MED ORDER — COVID-19 MRNA VAC-TRIS(PFIZER) 30 MCG/0.3ML IM SUSY
0.3000 mL | PREFILLED_SYRINGE | Freq: Once | INTRAMUSCULAR | 0 refills | Status: AC
  Filled 2022-12-02: qty 0.3, 1d supply, fill #0

## 2022-12-02 MED ORDER — INFLUENZA VIRUS VACC SPLIT PF (FLUZONE) 0.5 ML IM SUSY
0.5000 mL | PREFILLED_SYRINGE | Freq: Once | INTRAMUSCULAR | 0 refills | Status: AC
  Filled 2022-12-02: qty 0.5, 1d supply, fill #0

## 2022-12-02 NOTE — Progress Notes (Signed)
   Subjective:    Patient ID: Dawn Leach, female    DOB: 03/25/1965, 57 y.o.   MRN: [id number]  HPI chief complaint: Left knee pain  Patient is a very pleasant 57 year old female who presents today with left knee pain.  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  She had no pain at that time but later in the summer began to experience some medial sided knee pain which has persisted.  Her pain is most noticeable when going from a seated to a standing position while at work.  Once she is ambulatory her symptoms improved.  She has been self treating with KT tape and topical medications.  These have been somewhat helpful.  She initially injured this knee in 2021.  X-rays at that time showed some mild medial compartmental OA but not severe.  We discussed the possibility of an MRI back in to rule out a meniscal tear but her symptoms resolved.  She does think she gets a little bit of swelling in the knee.  No instability.  Interval medical history reviewed Medications reviewed Allergies reviewed    Review of Systems As above    Objective:   Physical Exam  Well-developed, well-nourished.  No acute distress  Left knee: Full range of motion.  No obvious effusion.  Slight tenderness to palpation along the medial joint line but negative McMurray's.  No tenderness along the lateral joint line.  Negative Thessaly.  Knee is stable ligamentous exam.  Trace patellofemoral crepitus.  Neurovascularly intact distally.      Assessment & Plan:   Left knee pain likely secondary to mild medial compartmental DJD  Patient's left knee is injected today with cortisone utilizing an anterior lateral approach.  She tolerates this without difficulty.  I think she would benefit from some physical therapy even if it is to learn a home exercise program.  She may continue with activity as tolerated and will follow-up for ongoing or recalcitrant issues.  Consent obtained and  verified. Time-out conducted. Noted no overlying erythema, induration, or other signs of local infection. Skin prepped in a sterile fashion. Topical analgesic spray: Ethyl chloride. Joint: Left knee Needle: 25-gauge 1.5 inch Completed without difficulty. Meds: 3 cc 1% Xylocaine, 1 cc (40 mg) Depo-Medrol    This note was dictated using Dragon naturally speaking software and may contain errors in syntax, spelling, or content which have not been identified prior to signing this note.

## 2023-01-01 ENCOUNTER — Ambulatory Visit: Payer: BC Managed Care – PPO | Attending: Sports Medicine | Admitting: Physical Therapy

## 2023-01-01 DIAGNOSIS — M25662 Stiffness of left knee, not elsewhere classified: Secondary | ICD-10-CM

## 2023-01-01 DIAGNOSIS — G8929 Other chronic pain: Secondary | ICD-10-CM

## 2023-01-01 DIAGNOSIS — M6281 Muscle weakness (generalized): Secondary | ICD-10-CM | POA: Diagnosis present

## 2023-01-01 DIAGNOSIS — M25562 Pain in left knee: Secondary | ICD-10-CM | POA: Diagnosis present

## 2023-01-01 DIAGNOSIS — M1712 Unilateral primary osteoarthritis, left knee: Secondary | ICD-10-CM

## 2023-01-01 NOTE — Therapy (Addendum)
OUTPATIENT PHYSICAL THERAPY LOWER EXTREMITY EVALUATION   Patient Name: Dawn Leach MRN: [id number] DOB:04/11/1965, 57 y.o., female Today's Date: 01/01/2023  END OF SESSION:  PT End of Session - 01/01/23 1158     Visit Number 1    Number of Visits 5   including eval   Date for PT Re-Evaluation 02/26/23    Authorization Type BCBS    Progress Note Due on Visit 10    PT Start Time 1158   pt arrived late   PT Stop Time 1232    PT Time Calculation (min) 34 min    Activity Tolerance Patient tolerated treatment well    Behavior During Therapy WFL for tasks assessed/performed             Past Medical History:  Diagnosis Date   Allergy    Anemia    Edema, lower extremity    Past Surgical History:  Procedure Laterality Date   NO PAST SURGERIES     Patient Active Problem List   Diagnosis Date Noted   Class 2 obesity due to excess calories with body mass index (BMI) of 36.0 to 36.9 in adult 12/31/2019   Prediabetes 12/22/2019    PCP: Sharlene Dory, DO  REFERRING PROVIDER: Ralene Cork, DO  REFERRING DIAG: [phone] (ICD-10-CM) - Osteoarthritis of left knee, unspecified osteoarthritis type  THERAPY DIAG:  Osteoarthritis of left knee, unspecified osteoarthritis type  Stiffness of left knee, not elsewhere classified  Chronic pain of left knee  Muscle weakness (generalized)  Rationale for Evaluation and Treatment: Rehabilitation  ONSET DATE: 12/02/2022 (referral date)  SUBJECTIVE:   SUBJECTIVE STATEMENT: "Looz" Pt hurt her L knee in November 2021 at the gym on the bike. When she woke up the next day she could barely walk, but it was ~75% better with exercise, time, and heat/cold modalities. However, this past summer she was training for work and this training exasperated the L knee pain.  PERTINENT HISTORY: Anemia PAIN:  Are you having pain? Yes: NPRS scale: 5/10 Pain location: L knee, predominantly medial joint line Pain description: sharp,  pinching Aggravating factors: "I don't really know", stepping wrong, going up stairs Relieving factors: sitting, exercising  PRECAUTIONS: None  RED FLAGS: Bowel or bladder incontinence: No   WEIGHT BEARING RESTRICTIONS: No  FALLS:  Has patient fallen in last 6 months? Yes. Number of falls "I did fall the other day, I sprayed some air freshener and then slipped"  LIVING ENVIRONMENT: Lives with: lives with their family and lives alone, daughter is away at college Lives in: House/apartment Stairs: No Has following equipment at home: None  OCCUPATION: Secretary/administrator School  PLOF: Independent  PATIENT GOALS: "feel better", "manage pain", "learn exercises for at home"  NEXT MD VISIT: 05/27/23, PCP ;contact sports medicine PRN  OBJECTIVE:  Note: Objective measures were completed at Evaluation unless otherwise noted.  DIAGNOSTIC FINDINGS:  DG Knee Complete 4 Views Left 02/14/2020 (most recent) FINDINGS: No fracture or dislocation is seen. The joint spaces are preserved. Visualized soft tissues are within normal limits. No definite suprapatellar knee joint effusion.  PATIENT SURVEYS:  WOMAC: 9/96  COGNITION: Overall cognitive status: Within functional limits for tasks assessed     SENSATION: WFL  EDEMA:  Pt reports edema was worse before injection Knee L R  Superior to Patella (circumferential measures) 41.5 cm 40.8 cm  Joint Line 36.2 cm 35.6 cm  Inferior to Patella 35.0 cm 36.0 cm    PALPATION: Medial tibial condyle painful  to palpation, no tenderness in joint line  LOWER EXTREMITY ROM:  Active ROM Right eval Left eval  Hip flexion    Hip extension    Hip abduction    Hip adduction    Hip internal rotation    Hip external rotation    Knee flexion 120*, painful   Knee extension 2* hyperextension, painful   Ankle dorsiflexion    Ankle plantarflexion    Ankle inversion    Ankle eversion     (Blank rows = not tested)  LOWER EXTREMITY MMT: 5/5,  no pain   FUNCTIONAL TESTS:   5x STS- 10.53 seconds, no increase in pain  6" step x15 seconds- completed 6 steps in 15 seconds    TODAY'S TREATMENT:                                                                                                                              N/a, eval only    PATIENT EDUCATION:  Education details: POC Person educated: Patient Education method: Explanation Education comprehension: verbalized understanding and needs further education  HOME EXERCISE PROGRAM: To be initiated  ASSESSMENT:  CLINICAL IMPRESSION: Patient is a 57 year old female referred to Neuro OPPT for L knee osteoarthritis. Pt's PMH is significant for: anemia. The following deficits were present during the exam: pain, mild edema, impaired mobility, and impaired body mechanics. Pt would benefit from skilled PT to address these impairments and functional limitations to maximize functional mobility independence.   OBJECTIVE IMPAIRMENTS: decreased mobility, decreased strength, impaired perceived functional ability, improper body mechanics, and pain.   ACTIVITY LIMITATIONS: lifting, squatting, stairs, and transfers  PARTICIPATION LIMITATIONS: community activity  PERSONAL FACTORS: Time since onset of injury/illness/exacerbation and 1 comorbidity:    anemia are also affecting patient's functional outcome.   REHAB POTENTIAL: Good  CLINICAL DECISION MAKING: Stable/uncomplicated  EVALUATION COMPLEXITY: Low   GOALS: Goals reviewed with patient? No  SHORT TERM GOALS= LONG TERM GOALS  LONG TERM GOALS: Target date: 02/26/2023   Pt will be independent with final HEP for improved strength, balance, transfers and gait. Baseline:  Goal status: INITIAL  2.  Pt will improve WOMAC to 0/96 (MCID 10) to demonstrate improved pain, stiffness, and physical function with ADLs and mobility. Baseline: 9/96 (01/01/23) Goal status: INITIAL  3.  Pt will be able to ascend one flight of stairs (~12  steps) with proper biomechanics and a pain increase no greater than 2/10 from baseline.  Baseline:  Goal status: INITIAL  PLAN:  PT FREQUENCY: every other week  PT DURATION: 8 weeks  PLANNED INTERVENTIONS: 97164- PT Re-evaluation, 97110-Therapeutic exercises, 97530- Therapeutic activity, 97112- Neuromuscular re-education, 97535- Self Care, 16109- Manual therapy, [id number]- Gait training, 97014- Electrical stimulation (unattended), [id number]- Electrical stimulation (manual), [id number]- Traction (mechanical), [id number]- Wound care (first 20 sq cm), 97598- Wound care (each additional 20 sq cm), Patient/Family education, Balance training, Stair training, Taping, Dry Needling, Joint mobilization, Joint manipulation, DME instructions, Cryotherapy, and Moist  heat  PLAN FOR NEXT SESSION: Initiate HEP w/ LLE strengthening, emphasis on hip abductors to reduce torque to knee and push-to-step technique for stairs; pain management strategies, emphasis on sit-to-stands and stair climbing with good body mechanics   Beverely Low, Student-PT 01/01/2023, 3:28 PM

## 2023-01-01 NOTE — Addendum Note (Signed)
Addended by: Peter Congo on: 01/01/2023 03:39 PM   Modules accepted: Orders

## 2023-01-16 ENCOUNTER — Encounter: Payer: Self-pay | Admitting: Physical Therapy

## 2023-01-16 ENCOUNTER — Ambulatory Visit: Payer: BC Managed Care – PPO | Attending: Family Medicine | Admitting: Physical Therapy

## 2023-01-16 VITALS — BP 118/74 | HR 60

## 2023-01-16 DIAGNOSIS — M25662 Stiffness of left knee, not elsewhere classified: Secondary | ICD-10-CM | POA: Insufficient documentation

## 2023-01-16 DIAGNOSIS — M6281 Muscle weakness (generalized): Secondary | ICD-10-CM | POA: Diagnosis present

## 2023-01-16 DIAGNOSIS — G8929 Other chronic pain: Secondary | ICD-10-CM | POA: Insufficient documentation

## 2023-01-16 DIAGNOSIS — M25562 Pain in left knee: Secondary | ICD-10-CM | POA: Diagnosis present

## 2023-01-16 DIAGNOSIS — M1712 Unilateral primary osteoarthritis, left knee: Secondary | ICD-10-CM | POA: Diagnosis present

## 2023-01-16 NOTE — Therapy (Signed)
OUTPATIENT PHYSICAL THERAPY LOWER EXTREMITY TREATMENT   Patient Name: Dawn Leach MRN: [id number] DOB:December 29, 1965, 57 y.o., female Today's Date: 01/16/2023  END OF SESSION:  PT End of Session - 01/16/23 0724     Visit Number 2    Number of Visits 5    Date for PT Re-Evaluation 02/26/23    Authorization Type BCBS    Progress Note Due on Visit 10    PT Start Time 0722    PT Stop Time 0800    PT Time Calculation (min) 38 min    Activity Tolerance Patient tolerated treatment well    Behavior During Therapy WFL for tasks assessed/performed             Past Medical History:  Diagnosis Date   Allergy    Anemia    Edema, lower extremity    Past Surgical History:  Procedure Laterality Date   NO PAST SURGERIES     Patient Active Problem List   Diagnosis Date Noted   Class 2 obesity due to excess calories with body mass index (BMI) of 36.0 to 36.9 in adult 12/31/2019   Prediabetes 12/22/2019    PCP: Sharlene Dory, DO  REFERRING PROVIDER: Ralene Cork, DO  REFERRING DIAG: [phone] (ICD-10-CM) - Osteoarthritis of left knee, unspecified osteoarthritis type  THERAPY DIAG:  Osteoarthritis of left knee, unspecified osteoarthritis type  Stiffness of left knee, not elsewhere classified  Chronic pain of left knee  Muscle weakness (generalized)  Rationale for Evaluation and Treatment: Rehabilitation  ONSET DATE: 12/02/2022 (referral date)  SUBJECTIVE:   SUBJECTIVE STATEMENT: "Looz" Pt reports that she was running a few minute late due to prepping for the day. She reports that her knee did not hurt at all last night but is hurting more in the morning. Denies falls and near falls since last here.   PERTINENT HISTORY: Anemia PAIN:  Are you having pain? Yes: NPRS scale: 6/10 Pain location: L knee, predominantly medial joint line Pain description: sharp, pinching Aggravating factors: "I don't really know", stepping wrong, going up stairs Relieving  factors: sitting, exercising  PRECAUTIONS: None  RED FLAGS: Bowel or bladder incontinence: No   WEIGHT BEARING RESTRICTIONS: No  FALLS:  Has patient fallen in last 6 months? Yes. Number of falls "I did fall the other day, I sprayed some air freshener and then slipped"  LIVING ENVIRONMENT: Lives with: lives with their family and lives alone, daughter is away at college Lives in: House/apartment Stairs: No Has following equipment at home: None  OCCUPATION: Secretary/administrator School  PLOF: Independent  PATIENT GOALS: "feel better", "manage pain", "learn exercises for at home"  NEXT MD VISIT: 05/27/23, PCP ;contact sports medicine PRN  OBJECTIVE:  Note: Objective measures were completed at Evaluation unless otherwise noted.  DIAGNOSTIC FINDINGS:  DG Knee Complete 4 Views Left 02/14/2020 (most recent) FINDINGS: No fracture or dislocation is seen. The joint spaces are preserved. Visualized soft tissues are within normal limits. No definite suprapatellar knee joint effusion.  PATIENT SURVEYS:  WOMAC: 9/96   TODAY'S TREATMENT:  Vitals:   01/16/23 0730  BP: 118/74  Pulse: 60  Seated on RUE; WNL  TherEx:  Scifit level 4 on twin hill setting x 6 min with UE/LE for reciprocal gait pattern, pain management, and gentle strengthening Reports decrease in pain from 6/10 to 2/10  Supine Knee Extension Strengthening - 2 sets - 10 reps Active Straight Leg Raise with Quad Set - 10 reps Half Deadlift Unweighted in today's session - 10 reps Straight leg raise 1 x 10 reps 4" step up with LLE and no UE x 15 reps  PATIENT EDUCATION:  Education details: Initial HEP Person educated: Patient Education method: Explanation Education comprehension: verbalized understanding and needs further education  HOME EXERCISE PROGRAM: Access Code: [email or web address] URL:  [email or web address] Date: 01/16/2023 Prepared by: Maryruth Eve  Exercises - Supine Knee Extension Strengthening  - 1 x daily - 7 x weekly - 3 sets - 10 reps - Active Straight Leg Raise with Quad Set  - 1 x daily - 7 x weekly - 3 sets - 10 reps - Half Deadlift with Kettlebell  - 1 x daily - 4 x weekly - 3 sets - 10 reps (start unweighted for now)  SciFit biking level 4 at gym on weekend for 10-15 minutes   ASSESSMENT:  CLINICAL IMPRESSION: Skilled PT session emphasized LE strengthening and creation of initial HEP. Patient reported major reduction in pain 6/10 to 2/10 with use of SciFit warmup. Mild pain with step up to 4" step and will benefit form ongoing work on knee and hip stability to help offload knee. Patient tolerated rest of session very well. Continue HEP.    OBJECTIVE IMPAIRMENTS: decreased mobility, decreased strength, impaired perceived functional ability, improper body mechanics, and pain.   ACTIVITY LIMITATIONS: lifting, squatting, stairs, and transfers  PARTICIPATION LIMITATIONS: community activity  PERSONAL FACTORS: Time since onset of injury/illness/exacerbation and 1 comorbidity:    anemia are also affecting patient's functional outcome.   REHAB POTENTIAL: Good  CLINICAL DECISION MAKING: Stable/uncomplicated  EVALUATION COMPLEXITY: Low   GOALS: Goals reviewed with patient? No  SHORT TERM GOALS= LONG TERM GOALS  LONG TERM GOALS: Target date: 02/26/2023   Pt will be independent with final HEP for improved strength, balance, transfers and gait. Baseline:  Goal status: INITIAL  2.  Pt will improve WOMAC to 0/96 (MCID 10) to demonstrate improved pain, stiffness, and physical function with ADLs and mobility. Baseline: 9/96 (01/01/23) Goal status: INITIAL  3.  Pt will be able to ascend one flight of stairs (~12 steps) with proper biomechanics and a pain increase no greater than 2/10 from baseline.  Baseline:  Goal status:  INITIAL  PLAN:  PT FREQUENCY: every other week  PT DURATION: 8 weeks  PLANNED INTERVENTIONS: 97164- PT Re-evaluation, 97110-Therapeutic exercises, 97530- Therapeutic activity, 97112- Neuromuscular re-education, 97535- Self Care, 95621- Manual therapy, [id number]- Gait training, 97014- Electrical stimulation (unattended), [id number]- Electrical stimulation (manual), [id number]- Traction (mechanical), [id number]- Wound care (first 20 sq cm), 97598- Wound care (each additional 20 sq cm), Patient/Family education, Balance training, Stair training, Taping, Dry Needling, Joint mobilization, Joint manipulation, DME instructions, Cryotherapy, and Moist heat  PLAN FOR NEXT SESSION: add to HEP with emphasis on hip abductors to reduce torque to knee and push-to-step technique for stairs; pain management strategies, emphasis on sit-to-stands and stair climbing with good body mechanics   Carmelia Bake, PT, DPT 01/16/2023, 8:22 AM

## 2023-02-05 ENCOUNTER — Encounter: Payer: Self-pay | Admitting: Physical Therapy

## 2023-02-05 NOTE — Therapy (Signed)
PHYSICAL THERAPY DISCHARGE SUMMARY  Visits from Start of Care: 2  Current functional level related to goals / functional outcomes: Unable to assess, not returned to PT and request for D/C   Remaining deficits: Unable to assess, not returned to PT and request for D/C   Education / Equipment: Unable to assess, not returned to PT and request for D/C   Patient agrees to discharge. Patient goals were not met. Patient is being discharged due to  patient request contacting front desk and reporting not seeing progress and wanting to D/C at this time.

## 2023-02-06 ENCOUNTER — Ambulatory Visit: Payer: BC Managed Care – PPO | Admitting: Physical Therapy

## 2023-02-20 ENCOUNTER — Ambulatory Visit: Payer: BC Managed Care – PPO | Admitting: Physical Therapy

## 2023-03-06 ENCOUNTER — Ambulatory Visit: Payer: BC Managed Care – PPO | Admitting: Physical Therapy

## 2023-05-27 ENCOUNTER — Encounter: Payer: BC Managed Care – PPO | Admitting: Family Medicine

## 2023-06-15 ENCOUNTER — Encounter: Admitting: Family Medicine

## 2023-07-14 ENCOUNTER — Ambulatory Visit: Payer: Self-pay | Admitting: Family Medicine

## 2023-07-14 ENCOUNTER — Other Ambulatory Visit (HOSPITAL_BASED_OUTPATIENT_CLINIC_OR_DEPARTMENT_OTHER): Payer: Self-pay | Admitting: Family Medicine

## 2023-07-14 ENCOUNTER — Ambulatory Visit (INDEPENDENT_AMBULATORY_CARE_PROVIDER_SITE_OTHER): Admitting: Family Medicine

## 2023-07-14 ENCOUNTER — Encounter: Payer: Self-pay | Admitting: Family Medicine

## 2023-07-14 VITALS — BP 122/70 | HR 79 | Temp 98.0°F | Resp 16 | Ht 62.0 in | Wt 195.0 lb

## 2023-07-14 DIAGNOSIS — R7303 Prediabetes: Secondary | ICD-10-CM

## 2023-07-14 DIAGNOSIS — E7800 Pure hypercholesterolemia, unspecified: Secondary | ICD-10-CM | POA: Diagnosis not present

## 2023-07-14 DIAGNOSIS — Z0000 Encounter for general adult medical examination without abnormal findings: Secondary | ICD-10-CM | POA: Diagnosis not present

## 2023-07-14 DIAGNOSIS — Z1231 Encounter for screening mammogram for malignant neoplasm of breast: Secondary | ICD-10-CM

## 2023-07-14 LAB — CBC
HCT: 38.3 % (ref 36.0–46.0)
Hemoglobin: 12.3 g/dL (ref 12.0–15.0)
MCHC: 32 g/dL (ref 30.0–36.0)
MCV: 82.9 fl (ref 78.0–100.0)
Platelets: 272 10*3/uL (ref 150.0–400.0)
RBC: 4.62 Mil/uL (ref 3.87–5.11)
RDW: 16.9 % — ABNORMAL HIGH (ref 11.5–15.5)
WBC: 3.5 10*3/uL — ABNORMAL LOW (ref 4.0–10.5)

## 2023-07-14 LAB — LIPID PANEL
Cholesterol: 215 mg/dL — ABNORMAL HIGH (ref 0–200)
HDL: 65.7 mg/dL (ref >39.00)
LDL Cholesterol: 131 mg/dL — ABNORMAL HIGH (ref 0–99)
NonHDL: 149.22
Total CHOL/HDL Ratio: 3
Triglycerides: 92 mg/dL (ref 0.0–149.0)
VLDL: 18.4 mg/dL (ref 0.0–40.0)

## 2023-07-14 LAB — COMPREHENSIVE METABOLIC PANEL WITH GFR
ALT: 16 U/L (ref 0–35)
AST: 15 U/L (ref 0–37)
Albumin: 4.3 g/dL (ref 3.5–5.2)
Alkaline Phosphatase: 99 U/L (ref 39–117)
BUN: 12 mg/dL (ref 6–23)
CO2: 29 meq/L (ref 19–32)
Calcium: 10 mg/dL (ref 8.4–10.5)
Chloride: 105 meq/L (ref 96–112)
Creatinine, Ser: 0.86 mg/dL (ref 0.40–1.20)
GFR: 74.81 mL/min (ref >60.00)
Glucose, Bld: 89 mg/dL (ref 70–99)
Potassium: 4.3 meq/L (ref 3.5–5.1)
Sodium: 140 meq/L (ref 135–145)
Total Bilirubin: 0.6 mg/dL (ref 0.2–1.2)
Total Protein: 7.3 g/dL (ref 6.0–8.3)

## 2023-07-14 LAB — HEMOGLOBIN A1C: Hgb A1c MFr Bld: 6 % (ref 4.6–6.5)

## 2023-07-14 NOTE — Progress Notes (Signed)
 Chief Complaint  Patient presents with   Annual Exam    CPE     Well Woman Dawn Leach is here for a complete physical.   Her last physical was >1 year ago.  Current diet: in general, a "healthy" diet. Current exercise: lifting weights, cardio. Weight is stable and she denies fatigue out of ordinary. Seatbelt? Yes Advanced directive? Yes  Health Maintenance Pap/HPV- Yes Mammogram- Yes Colon cancer screening-Yes Shingrix - Yes Tetanus- Yes Hep C screening- Yes HIV screening- Yes  Past Medical History:  Diagnosis Date   Allergy    Anemia    Edema, lower extremity      Past Surgical History:  Procedure Laterality Date   NO PAST SURGERIES      Medications  Current Outpatient Medications on File Prior to Visit  Medication Sig Dispense Refill   Cholecalciferol (VITAMIN D3) 5000 units CAPS Take 5,000 Units by mouth daily.     Multiple Vitamin (MULTIVITAMIN) tablet Take 1 tablet by mouth daily.      Allergies Allergies  Allergen Reactions   Cocos Nucifera    Penicillins Other (See Comments)    seizures   Shellfish Allergy     Review of Systems: Constitutional:  no unexpected weight changes Eye:  no recent significant change in vision Ear/Nose/Mouth/Throat:  Ears:  no recent change in hearing Nose/Mouth/Throat:  no complaints of nasal congestion, no sore throat Cardiovascular: no chest pain Respiratory:  no shortness of breath Gastrointestinal:  no abdominal pain, no change in bowel habits GU:  Female: negative for dysuria or pelvic pain Musculoskeletal/Extremities:  no pain of the joints Integumentary (Skin/Breast):  no abnormal skin lesions reported Neurologic:  no headaches Endocrine:  denies fatigue  Exam BP 122/70 (BP Location: Left Arm, Patient Position: Sitting)   Pulse 79   Temp 98 F (36.7 C) (Oral)   Resp 16   Ht 5\' 2"  (1.575 m)   Wt 195 lb (88.5 kg)   SpO2 96%   BMI 35.67 kg/m  General:  well developed, well nourished, in no apparent  distress Skin:  no significant moles, warts, or growths Head:  no masses, lesions, or tenderness Eyes:  pupils equal and round, sclera anicteric without injection Ears:  canals without lesions, TMs shiny without retraction, no obvious effusion, no erythema Nose:  nares patent, mucosa normal, and no drainage  Throat/Pharynx:  lips and gingiva without lesion; tongue and uvula midline; non-inflamed pharynx; no exudates or postnasal drainage Neck: neck supple without adenopathy, thyromegaly, or masses Lungs:  clear to auscultation, breath sounds equal bilaterally, no respiratory distress Cardio:  regular rate and rhythm, no LE edema Abdomen:  abdomen soft, nontender; bowel sounds normal; no masses or organomegaly Genital: Defer to GYN Musculoskeletal:  symmetrical muscle groups noted without atrophy or deformity Extremities:  no clubbing, cyanosis, or edema, no deformities, no skin discoloration Neuro:  gait normal; deep tendon reflexes normal and symmetric Psych: well oriented with normal range of affect and appropriate judgment/insight  Assessment and Plan  Well adult exam - Plan: CBC, Comprehensive metabolic panel with GFR, Lipid panel  Prediabetes - Plan: Hemoglobin A1c   Well 58 y.o. female. Counseled on diet and exercise. Advanced directive form requested today.  Discussed screening for CAD with a CACS. Agreed to undergo testing. Discussed $100-150 OOP cost.  Other orders as above. Follow up in 1 year. The patient voiced understanding and agreement to the plan.  [name], DO 07/14/23 8:40 AM

## 2023-07-14 NOTE — Patient Instructions (Addendum)
 Give us  2-3 business days to get the results of your labs back.   Give me a 3rd day of weight training weekly.   Keep the diet clean and stay active.  Please get me a copy of your advanced directive form at your convenience.   Let us  know if you need anything.

## 2023-08-20 ENCOUNTER — Encounter (HOSPITAL_BASED_OUTPATIENT_CLINIC_OR_DEPARTMENT_OTHER): Payer: Self-pay

## 2023-08-20 ENCOUNTER — Other Ambulatory Visit (HOSPITAL_BASED_OUTPATIENT_CLINIC_OR_DEPARTMENT_OTHER)

## 2023-08-20 ENCOUNTER — Ambulatory Visit (HOSPITAL_BASED_OUTPATIENT_CLINIC_OR_DEPARTMENT_OTHER)
Admission: RE | Admit: 2023-08-20 | Discharge: 2023-08-20 | Disposition: A | Discharge status: Home / Self Care | Source: Ambulatory Visit | Attending: Family Medicine | Admitting: Family Medicine

## 2023-08-20 DIAGNOSIS — Z1231 Encounter for screening mammogram for malignant neoplasm of breast: Secondary | ICD-10-CM | POA: Diagnosis present

## 2023-08-26 ENCOUNTER — Ambulatory Visit (INDEPENDENT_AMBULATORY_CARE_PROVIDER_SITE_OTHER): Admitting: Obstetrics & Gynecology

## 2023-08-26 ENCOUNTER — Other Ambulatory Visit (HOSPITAL_COMMUNITY)
Admission: RE | Admit: 2023-08-26 | Discharge: 2023-08-26 | Disposition: A | Discharge status: Home / Self Care | Source: Ambulatory Visit | Attending: Obstetrics & Gynecology | Admitting: Obstetrics & Gynecology

## 2023-08-26 VITALS — BP 95/70 | HR 70 | Wt 198.0 lb

## 2023-08-26 DIAGNOSIS — Z01419 Encounter for gynecological examination (general) (routine) without abnormal findings: Secondary | ICD-10-CM | POA: Diagnosis present

## 2023-08-26 DIAGNOSIS — Z30431 Encounter for routine checking of intrauterine contraceptive device: Secondary | ICD-10-CM | POA: Diagnosis not present

## 2023-08-26 NOTE — Progress Notes (Signed)
 Subjective:     Dawn Leach is a 58 y.o. female here for a routine exam.  Current complaints: Pt has IUD. Wants to keep a long as possible. No problems with it currently Has not completed 8 years. Requests PAP.    Gynecologic History No LMP recorded. (Menstrual status: IUD). Contraception: IUD (does not want it removed)  Last Pap: 06/04/2022. Results were: normal Last mammogram: 06/04/2022. Results were: normal  Obstetric History OB History  Gravida Para Term Preterm AB Living  2 1 1  0 1 1  SAB IAB Ectopic Multiple Live Births  1 0 0 0 1    # Outcome Date GA Lbr Len/2nd Weight Sex Type Anes PTL Lv  2 SAB      SAB     1 Term      Vag-Spont   LIV     The following portions of the patient's history were reviewed and updated as appropriate: allergies, current medications, past family history, past medical history, past social history, past surgical history, and problem list.  Review of Systems Pertinent items are noted in HPI.    Objective:  BP 95/70 (BP Location: Left Arm, Patient Position: Sitting, Cuff Size: Large)   Pulse 70   Wt 198 lb (89.8 kg)   BMI 36.21 kg/m  General Appearance:    Alert, cooperative, no distress, appears stated age  Head:    Normocephalic, without obvious abnormality, atraumatic  Eyes:    conjunctiva/corneas clear, EOM's intact, both eyes  Ears:    Normal external ear canals, both ears  Nose:   Nares normal, septum midline, mucosa normal, no drainage    or sinus tenderness  Throat:   Lips, mucosa, and tongue normal; teeth and gums normal  Neck:   Supple, symmetrical, trachea midline, no adenopathy;    thyroid :  no enlargement/tenderness/nodules  Back:     Symmetric, no curvature, ROM normal, no CVA tenderness  Lungs:     respirations unlabored  Chest Wall:    No tenderness or deformity   Heart:    Regular rate and rhythm  Breast Exam:    No tenderness, masses, or nipple abnormality  Abdomen:     Soft, non-tender, bowel sounds active all four  quadrants,    no masses, no organomegaly  Genitalia:    Normal female without lesion, discharge or tenderness   Uterus: small mobile; no adnexal masses   Extremities:   Extremities normal, atraumatic, no cyanosis or edema  Pulses:   2+ and symmetric all extremities  Skin:   Skin color, texture, turgor normal, no rashes or lesions     Assessment:    Healthy female exam.  IUD check up - reviewed timing of LnIUD       Plan:  Diagnoses and all orders for this visit:  Well female exam with routine gynecological exam -     Cytology - PAP( [facility])  IUD check up   F/u in 1 year or sooner prn

## 2023-09-01 ENCOUNTER — Encounter: Payer: Self-pay | Admitting: Obstetrics & Gynecology

## 2023-09-01 ENCOUNTER — Ambulatory Visit: Payer: Self-pay | Admitting: Obstetrics & Gynecology

## 2023-09-01 LAB — CYTOLOGY - PAP
Comment: NEGATIVE
Diagnosis: NEGATIVE
Diagnosis: REACTIVE
High risk HPV: NEGATIVE

## 2023-09-17 ENCOUNTER — Other Ambulatory Visit (HOSPITAL_BASED_OUTPATIENT_CLINIC_OR_DEPARTMENT_OTHER)

## 2024-01-01 ENCOUNTER — Ambulatory Visit

## 2024-01-01 ENCOUNTER — Other Ambulatory Visit: Payer: Self-pay

## 2024-01-01 VITALS — BP 110/84 | Ht 62.0 in | Wt 198.0 lb

## 2024-01-01 DIAGNOSIS — M1711 Unilateral primary osteoarthritis, right knee: Secondary | ICD-10-CM

## 2024-01-01 DIAGNOSIS — M1712 Unilateral primary osteoarthritis, left knee: Secondary | ICD-10-CM

## 2024-01-01 MED ORDER — METHYLPREDNISOLONE ACETATE 40 MG/ML IJ SUSP
40.0000 mg | Freq: Once | INTRAMUSCULAR | Status: AC
  Administered 2024-01-01: 40 mg via INTRA_ARTICULAR

## 2024-01-01 NOTE — Progress Notes (Signed)
   Subjective:    Patient ID: Dawn Leach, female    DOB: 58 y.o., [birth year]   MRN: [id number]  Chief Complaint: Left knee pain  Discussed the use of AI scribe software for clinical note transcription with the patient, who gave verbal consent to proceed.  History of Present Illness Dawn Leach is a 58 year old female with a history of left knee pain who presents with a flare-up of left knee pain.  Left knee pain and mechanical symptoms - Severe left knee pain and swelling occurred after lifting weights, suspecting excessive weight load. - Swelling has decreased, but persistent pinching sensation remains in the knee. - Pain is intermittently accompanied by popping or catching sensations. - Occasional sensation of knee instability, described as the knee giving way, causing concern for potential falls. - Pain flares occasionally, such as after participating in a defense class in 2023, but was manageable until the recent weightlifting incident.  Prior interventions and response - Received a steroid injection in October 2024, resulting in significant pain relief. - Previously attempted physical therapy, but found it unhelpful.  Current pain management strategies - Uses Voltaren gel twice daily for symptomatic relief. - Avoids ibuprofen unless pain is severe. - Utilizes heat pads for comfort. - Did not complete the recommended regimen of alternating ice and heat treatment.   Review of pertinent imaging: 4 view plain film radiographs obtained of the Left knee on 02/14/2020 per my independent review revealing mild medial compartment joint space narrowing most apparent on the [place] view.  No significant osteophytic changes.  Mild lateral patellar glide.  No appreciable effusion.  No acute osseous abnormalities.    Objective:   Vitals:   01/01/24 0815  BP: 110/84    Left knee (compared to normal) Visible effusion. Tenderness to palpation along the medial joint line most  prominently Positive McMurry test.  Positive Thessaly test. Negative anterior drawer.   Stable and painless with varus/valgus stress.     Intra-articular Knee Injection with Ultrasound Guidance Procedure Note Dawn Leach 1966-02-25 Indications: Pain Procedure Details Following the description of risks including infection, bleeding, damage to surrounding structures, patient provided written consent for left knee joint injection procedure. US  was used to identify the suprapatellar pouch. Patient was sterilely prepped in the usual fashion with alcohol.  Following topical anesthetization with ethyl chloride they were injected with a solution of 40mg  Depo-medrol and 3cc Mepivacaine 2% via the superolateral approach into the suprapatellar pouch. This was well visualized under ultrasound, please see associated photographic documentation. Patient tolerated well without complication.  Precautions provided. Cleaned and dressing applied.    Assessment & Plan:   Assessment & Plan Left knee osteoarthritis with effusion   Chronic left knee osteoarthritis has worsened after weightlifting, causing significant pain, swelling, and effusion. Previous steroid injection relieved symptoms for a year. She currently experiences pain, occasional popping, and knee instability. Examination reveals pain on movement and effusion. X-rays from 2021 suggest degenerative changes as the source of her pain. Administer a knee injection with steroid today. Use Voltaren gel up to four times daily. Physical therapy was discussed, but she prefers the current management strategy.  Follow-up as needed.

## 2024-02-22 ENCOUNTER — Telehealth: Payer: Self-pay

## 2024-02-22 ENCOUNTER — Telehealth: Admitting: Physician Assistant

## 2024-02-22 DIAGNOSIS — R3989 Other symptoms and signs involving the genitourinary system: Secondary | ICD-10-CM

## 2024-02-22 DIAGNOSIS — B3731 Acute candidiasis of vulva and vagina: Secondary | ICD-10-CM

## 2024-02-22 MED ORDER — FLUCONAZOLE 150 MG PO TABS: 150.0000 mg | ORAL_TABLET | ORAL | 0 refills | Status: AC | PRN

## 2024-02-22 MED ORDER — NITROFURANTOIN MONOHYD MACRO 100 MG PO CAPS: 100.0000 mg | ORAL_CAPSULE | Freq: Two times a day (BID) | ORAL | 0 refills | Status: AC

## 2024-02-22 NOTE — Telephone Encounter (Signed)
 Patient called stating that she has a rash on the inside of her legs. She wanted to see a provider in the office because she did not know if it was yeast or a UTI.   Spoke with our nurse, [facility] and we informed the patient that our schedule is full due to the holiday. Informed her that she could go to her PCP for an appointment and she did not want a female provider performing an  exam. Informed patient that the provider we have in office today is a female as well and we do not have a provider in office tomorrow.   Patient was going to call PCP office to schedule an appointment. I apologized for the inconvenience.

## 2024-02-22 NOTE — Progress Notes (Signed)
 " Virtual Visit Consent   Dawn Leach, you are scheduled for a virtual visit with a [facility] provider today. Just as with appointments in the office, your consent must be obtained to participate. Your consent will be active for this visit and any virtual visit you may have with one of our providers in the next 365 days. If you have a MyChart account, a copy of this consent can be sent to you electronically.  As this is a virtual visit, video technology does not allow for your provider to perform a traditional examination. This may limit your provider's ability to fully assess your condition. If your provider identifies any concerns that need to be evaluated in person or the need to arrange testing (such as labs, EKG, etc.), we will make arrangements to do so. Although advances in technology are sophisticated, we cannot ensure that it will always work on either your end or our end. If the connection with a video visit is poor, the visit may have to be switched to a telephone visit. With either a video or telephone visit, we are not always able to ensure that we have a secure connection.  By engaging in this virtual visit, you consent to the provision of healthcare and authorize for your insurance to be billed (if applicable) for the services provided during this visit. Depending on your insurance coverage, you may receive a charge related to this service.  I need to obtain your verbal consent now. Are you willing to proceed with your visit today? Dawn Leach has provided verbal consent on 58/22/2025 for a virtual visit (video or telephone). Delon CHRISTELLA Dickinson, PA-C  Date: 02/22/2024 4:27 PM   Virtual Visit via Video Note   I, Delon CHRISTELLA Dickinson, connected with  Dawn Leach  ([id number], March 24, 1965) on 02/22/2024 at  4:15 PM EST by a video-enabled telemedicine application and verified that I am speaking with the correct person using two identifiers.  Location: Patient: Virtual Visit Location  Patient: Home Provider: Virtual Visit Location Provider: Home Office   I discussed the limitations of evaluation and management by telemedicine and the availability of in person appointments. The patient expressed understanding and agreed to proceed.    History of Present Illness: Dawn Leach is a 58 y.o. who identifies as a female who was assigned female at birth, and is being seen today for vaginal irritation and urinary changes.  HPI: Urinary Tract Infection  This is a new problem. The current episode started 1 to 4 weeks ago (2 weeks; had stayed out of town and symptoms started after that; has a lot of allergies so unsure if initially caused by a different detergent or toilet paper she is not used to using). The problem has been unchanged. The quality of the pain is described as aching. The pain is mild. There has been no fever. Associated symptoms include urgency. Pertinent negatives include no chills, flank pain, frequency, hematuria, hesitancy or nausea. Associated symptoms comments: Itching at vaginal introitus. Treatments tried: AZO and Monistat. The treatment provided no relief.    Problems:  Patient Active Problem List   Diagnosis Date Noted   Class 2 obesity due to excess calories with body mass index (BMI) of 36.0 to 36.9 in adult 12/31/2019   Prediabetes 12/22/2019    Allergies: Allergies[1] Medications: Current Medications[2]  Observations/Objective: Patient is well-developed, well-nourished in no acute distress.  Resting comfortably at home.  Head is normocephalic, atraumatic.  No labored breathing.  Speech is  clear and coherent with logical content.  Patient is alert and oriented at baseline.    Assessment and Plan: 1. Suspected UTI (Primary) - nitrofurantoin , macrocrystal-monohydrate, (MACROBID ) 100 MG capsule; Take 1 capsule (100 mg total) by mouth 2 (two) times daily.  Dispense: 10 capsule; Refill: 0  2. Yeast vaginitis - fluconazole  (DIFLUCAN ) 150 MG tablet;  Take 1 tablet (150 mg total) by mouth every 3 (three) days as needed.  Dispense: 3 tablet; Refill: 0  - Worsening symptoms.  - Will treat empirically with Macrobid  - May use AZO for bladder spasms - Continue to push fluids.  - Fluconazole  prescribed for vaginal irritation - Seek in person evaluation for urine culture if symptoms do not improve or if they worsen.    Follow Up Instructions: I discussed the assessment and treatment plan with the patient. The patient was provided an opportunity to ask questions and all were answered. The patient agreed with the plan and demonstrated an understanding of the instructions.  A copy of instructions were sent to the patient via MyChart unless otherwise noted below.    The patient was advised to call back or seek an in-person evaluation if the symptoms worsen or if the condition fails to improve as anticipated.    Bascom Biel M Chetara Kropp, PA-C     [1]  Allergies Allergen Reactions   Cocos Nucifera    Penicillins Other (See Comments)    seizures   Shellfish Allergy   [2]  Current Outpatient Medications:    fluconazole  (DIFLUCAN ) 150 MG tablet, Take 1 tablet (150 mg total) by mouth every 3 (three) days as needed., Disp: 3 tablet, Rfl: 0   nitrofurantoin , macrocrystal-monohydrate, (MACROBID ) 100 MG capsule, Take 1 capsule (100 mg total) by mouth 2 (two) times daily., Disp: 10 capsule, Rfl: 0   Cholecalciferol (VITAMIN D3) 5000 units CAPS, Take 5,000 Units by mouth daily., Disp: , Rfl:    Multiple Vitamin (MULTIVITAMIN) tablet, Take 1 tablet by mouth daily., Disp: , Rfl:   "

## 2024-02-22 NOTE — Patient Instructions (Signed)
 " Sinclair KANDICE Gains, thank you for joining Delon CHRISTELLA Dickinson, PA-C for today's virtual visit.  While this provider is not your primary care provider (PCP), if your PCP is located in our provider database this encounter information will be shared with them immediately following your visit.   A [facility] MyChart account gives you access to today's visit and all your visits, tests, and labs performed at [facility]  click here if you don't have a [facility] MyChart account or go to [email or web address]  Consent: (Patient) Dawn Leach provided verbal consent for this virtual visit at the beginning of the encounter.  Current Medications:  Current Outpatient Medications:    fluconazole  (DIFLUCAN ) 150 MG tablet, Take 1 tablet (150 mg total) by mouth every 3 (three) days as needed., Disp: 3 tablet, Rfl: 0   nitrofurantoin , macrocrystal-monohydrate, (MACROBID ) 100 MG capsule, Take 1 capsule (100 mg total) by mouth 2 (two) times daily., Disp: 10 capsule, Rfl: 0   Cholecalciferol (VITAMIN D3) 5000 units CAPS, Take 5,000 Units by mouth daily., Disp: , Rfl:    Multiple Vitamin (MULTIVITAMIN) tablet, Take 1 tablet by mouth daily., Disp: , Rfl:    Medications ordered in this encounter:  Meds ordered this encounter  Medications   nitrofurantoin , macrocrystal-monohydrate, (MACROBID ) 100 MG capsule    Sig: Take 1 capsule (100 mg total) by mouth 2 (two) times daily.    Dispense:  10 capsule    Refill:  0    Supervising Provider:   LAMPTEY, PHILIP O [[id number]]   fluconazole  (DIFLUCAN ) 150 MG tablet    Sig: Take 1 tablet (150 mg total) by mouth every 3 (three) days as needed.    Dispense:  3 tablet    Refill:  0    Supervising Provider:   LAMPTEY, PHILIP O [[id number]]     *If you need refills on other medications prior to your next appointment, please contact your pharmacy*  Follow-Up: Call back or seek an in-person evaluation if the symptoms worsen or if the condition fails to improve  as anticipated.  [facility] [phone]  Other Instructions Vaginal Probiotics: AZO vaginal probiotic OLLY Happy Hoo-Ha RAW Vaginal Care RenewLife Women's vaginal probiotic RepHresh Pro-B  Vaginal washes: Honey Pot Summer's Eve Vagisil Feminine cleanser  Boric Acid Suppositories  Healthy vaginal hygiene practices    -  Avoid sleeper pajamas. Nightgowns allow air to circulate.  Sleep without underpants whenever possible.   -  Wear cotton underpants during the day. Double-rinse underwear after washing to avoid residual irritants. Do not use fabric softeners for underwear and swimsuits.   - Avoid tights, leotards, leggings, skinny jeans, and other tight-fitting clothing. Skirts and loose-fitting pants allow air to circulate.   - Avoid pantyliners.  Instead use tampons or cotton pads.   - Use the restroom after intercourse to help prevent UTI's   - Daily warm bathing is helpful:     - Soak in clean water (no soap) for 10 to 15 minutes. Adding vinegar or baking soda to the water has not been specifically studied and may not be better than clean water alone.      - Use soap to wash regions other than the genital area just before getting out of the tub. Limit use of any soap on genital areas. Use fragance-free soaps.     - Rinse the genital area well and gently pat dry.  Don't rub.  Hair dryer to assist with drying can be used only if  on cool setting.     - Do not use bubble baths or perfumed soaps.   - Do not use any feminine sprays, douches or powders.  These contain chemicals that will irritate the skin.   - If the genital area is tender or swollen, cool compresses may relieve the discomfort. Unscented wet wipes can be used instead of toilet paper for wiping.    - Emollients, such as Vaseline, may help protect skin and can be applied to the irritated area.   - Always remember to wipe front-to-back after bowel movements. Pat dry after urination.   - Do not  sit in wet swimsuits for long periods of time after swimming   If you have been instructed to have an in-person evaluation today at a local Urgent Care facility, please use the link below. It will take you to a list of all of our available [facility], including address, phone number and hours of operation. Please do not delay care.  [facility]  If you or a family member do not have a primary care provider, use the link below to schedule a visit and establish care. When you choose a [facility] primary care physician or advanced practice provider, you gain a long-term partner in health. Find a Primary Care Provider  Learn more about [facility]'s in-office and virtual care options: [facility] - Get Care Now "

## 2024-03-09 ENCOUNTER — Ambulatory Visit: Admitting: Obstetrics and Gynecology

## 2024-03-09 ENCOUNTER — Other Ambulatory Visit (HOSPITAL_BASED_OUTPATIENT_CLINIC_OR_DEPARTMENT_OTHER): Payer: Self-pay

## 2024-03-09 VITALS — BP 118/60 | HR 72 | Wt 191.0 lb

## 2024-03-09 DIAGNOSIS — L304 Erythema intertrigo: Secondary | ICD-10-CM

## 2024-03-09 DIAGNOSIS — R399 Unspecified symptoms and signs involving the genitourinary system: Secondary | ICD-10-CM

## 2024-03-09 MED ORDER — KETOCONAZOLE 2 % EX CREA
1.0000 | TOPICAL_CREAM | Freq: Every day | CUTANEOUS | 0 refills | Status: DC
  Filled 2024-03-09: qty 15, 30d supply, fill #0

## 2024-03-09 NOTE — Progress Notes (Signed)
" ° °  ESTABLISHED GYNECOLOGY VISIT Chief Complaint  Patient presents with   skin irritation    Subjective:  Dawn Leach is a 59 y.o. G2P1011 presenting for irritation in her skin folds  She points to her waist just above her mons in her pannus region and in her groin. Denies any vaginal itching or irritation. States her PCP treated her for a UTI and yeast infection recently and her irritation slightly improved but still persists. Describes as red and itchy. She requests UA to check her urine.    Review of Systems:   Pertinent items are noted in HPI  Pertinent History Reviewed:  Reviewed past medical,surgical, social and family history.  Reviewed problem list, medications and allergies.  Objective:   Vitals:   03/09/24 1516  BP: 118/60  Pulse: 72  Weight: 191 lb 0.6 oz (86.7 kg)   Physical Examination:   General appearance - well appearing, and in no distress  Mental status - alert, oriented to person, place, and time  Psych:  normal mood and affect  Skin - slight erythema in pannus and groin     Assessment and Plan:  1. Intertrigo (Primary) Recommend topical treatment and f/u with PCP if not improving. No vulvovaginal symptoms - ketoconazole  (NIZORAL ) 2 % cream; Apply 1 Application topically daily for 14 days.  Dispense: 14 g; Refill: 0  2. Urinary symptom or sign UA neg - POCT urinalysis dipstick   Future Appointments  Date Time Provider Department Center  07/15/2024  8:30 AM Frann Mabel Mt, DO [facility] [id number] ONEIDA Abigail, MD, FACOG Obstetrician & Gynecologist, [facility], [facility] "

## 2024-03-10 LAB — POCT URINALYSIS DIPSTICK
Bilirubin, UA: NEGATIVE
Blood, UA: NEGATIVE
Glucose, UA: NEGATIVE
Ketones, UA: NEGATIVE
Leukocytes, UA: NEGATIVE
Nitrite, UA: NEGATIVE
Protein, UA: NEGATIVE
Spec Grav, UA: 1.01
Urobilinogen, UA: 0.2 U/dL
pH, UA: 6.5

## 2024-03-15 ENCOUNTER — Other Ambulatory Visit (HOSPITAL_BASED_OUTPATIENT_CLINIC_OR_DEPARTMENT_OTHER): Payer: Self-pay

## 2024-03-15 ENCOUNTER — Other Ambulatory Visit: Payer: Self-pay | Admitting: Family Medicine

## 2024-03-15 DIAGNOSIS — L304 Erythema intertrigo: Secondary | ICD-10-CM

## 2024-03-15 MED ORDER — KETOCONAZOLE 2 % EX CREA
1.0000 | TOPICAL_CREAM | Freq: Every day | CUTANEOUS | 0 refills | Status: AC
  Filled 2024-03-15: qty 15, 15d supply, fill #0
  Filled 2024-03-17: qty 15, 30d supply, fill #0
  Filled 2024-03-17: qty 15, 15d supply, fill #0

## 2024-03-17 ENCOUNTER — Other Ambulatory Visit (HOSPITAL_BASED_OUTPATIENT_CLINIC_OR_DEPARTMENT_OTHER): Payer: Self-pay

## 2024-07-15 ENCOUNTER — Encounter: Admitting: Family Medicine
